# Patient Record
Sex: Male | Born: 1970
Health system: Southern US, Community
[De-identification: ages and names within clinical notes are randomized; demographics above are authoritative.]

## PROBLEM LIST (undated history)

## (undated) DIAGNOSIS — L309 Dermatitis, unspecified: Secondary | ICD-10-CM

## (undated) DIAGNOSIS — K649 Unspecified hemorrhoids: Secondary | ICD-10-CM

## (undated) DIAGNOSIS — U071 COVID-19: Secondary | ICD-10-CM

## (undated) DIAGNOSIS — R7303 Prediabetes: Secondary | ICD-10-CM

## (undated) DIAGNOSIS — K769 Liver disease, unspecified: Secondary | ICD-10-CM

## (undated) DIAGNOSIS — I2583 Coronary atherosclerosis due to lipid rich plaque: Secondary | ICD-10-CM

## (undated) DIAGNOSIS — C73 Malignant neoplasm of thyroid gland: Secondary | ICD-10-CM

## (undated) DIAGNOSIS — B1910 Unspecified viral hepatitis B without hepatic coma: Secondary | ICD-10-CM

## (undated) DIAGNOSIS — B181 Chronic viral hepatitis B without delta-agent: Secondary | ICD-10-CM

## (undated) HISTORY — DX: Dermatitis, unspecified: L30.9

## (undated) HISTORY — DX: COVID-19: U07.1

## (undated) HISTORY — DX: Chronic viral hepatitis B without delta-agent: B18.1

## (undated) HISTORY — DX: Prediabetes: R73.03

## (undated) HISTORY — PX: HEMORRHOID SURGERY: SHX153

## (undated) HISTORY — DX: Coronary atherosclerosis due to lipid rich plaque: I25.83

## (undated) HISTORY — PX: COLONOSCOPY: SHX174

## (undated) HISTORY — DX: Liver disease, unspecified: K76.9

## (undated) HISTORY — DX: Malignant neoplasm of thyroid gland: C73

## (undated) HISTORY — DX: Unspecified hemorrhoids: K64.9

## (undated) HISTORY — DX: Unspecified viral hepatitis B without hepatic coma: B19.10

## (undated) HISTORY — PX: TOTAL THYROIDECTOMY: SHX2547

---

## 2011-05-07 ENCOUNTER — Encounter: Payer: Self-pay | Admitting: Internal Medicine

## 2016-10-18 DIAGNOSIS — A048 Other specified bacterial intestinal infections: Secondary | ICD-10-CM

## 2016-10-18 HISTORY — DX: Other specified bacterial intestinal infections: A04.8

## 2017-03-29 ENCOUNTER — Ambulatory Visit (INDEPENDENT_AMBULATORY_CARE_PROVIDER_SITE_OTHER): Payer: BLUE CROSS/BLUE SHIELD

## 2017-03-29 ENCOUNTER — Encounter: Payer: Self-pay | Admitting: Family Medicine

## 2017-03-29 ENCOUNTER — Ambulatory Visit (INDEPENDENT_AMBULATORY_CARE_PROVIDER_SITE_OTHER): Payer: BLUE CROSS/BLUE SHIELD | Admitting: Family Medicine

## 2017-03-29 VITALS — BP 148/86 | HR 83 | Temp 98.3°F | Resp 16 | Ht 69.5 in | Wt 162.6 lb

## 2017-03-29 DIAGNOSIS — R059 Cough, unspecified: Secondary | ICD-10-CM

## 2017-03-29 DIAGNOSIS — F5104 Psychophysiologic insomnia: Secondary | ICD-10-CM | POA: Diagnosis not present

## 2017-03-29 DIAGNOSIS — H6692 Otitis media, unspecified, left ear: Secondary | ICD-10-CM

## 2017-03-29 DIAGNOSIS — R05 Cough: Secondary | ICD-10-CM | POA: Diagnosis not present

## 2017-03-29 DIAGNOSIS — H6121 Impacted cerumen, right ear: Secondary | ICD-10-CM | POA: Diagnosis not present

## 2017-03-29 DIAGNOSIS — F4323 Adjustment disorder with mixed anxiety and depressed mood: Secondary | ICD-10-CM | POA: Diagnosis not present

## 2017-03-29 MED ORDER — AZITHROMYCIN 250 MG PO TABS: ORAL_TABLET | ORAL | 0 refills | Status: DC

## 2017-03-29 MED ORDER — CLONAZEPAM 0.5 MG PO TABS: 0.5000 mg | ORAL_TABLET | Freq: Two times a day (BID) | ORAL | 1 refills | Status: DC | PRN

## 2017-03-29 NOTE — Progress Notes (Signed)
Subjective:  By signing my name below, I, Allen Ellis, attest that this documentation has been prepared under the direction and in the presence of Allen Agreste, MD Electronically Signed: Ladene Ellis, ED Scribe 03/29/2017 at 4:12 PM.   Patient ID: Allen Ellis, male    DOB: June 08, 1971, 46 y.o.   MRN: [id number]  Chief Complaint  Patient presents with  . Depression    PER TRIAGE  . URI    WOULD LIKE PRINTED PRESCRIPTIONS   HPI  Allen Ellis is a 46 y.o. male who presents to Primary Care at [facility] complaining of URI symptoms for the past 3-4 days. Pt reports symptoms of subjective fever, chills, right ear pressure, nasal congestion, rhinorrhea, sore throat, productive cough with yellow/green sputum, back pain. He states that his children have been ill with similar symptoms. Pt has tried Mucinex without significant relief. He last had an ear infection 5-6 months ago. H/o thyroid CA; radioactive. Not on chemotherapy now.   Depression  Pt has been struggling with depression for the past 2 months. He states that he moved to [place], [place] from [place] for a job. States that his wife and 3 children were supposed to move here with him but now his wife has expressed to him that she no longer wants to move. She now wants to move the children to [place], [place] with her parents. Pt states that he has not met with a therapist or a counselor but he has discussed depression with his sisters. Pt admits to SI approximately 1 month ago when his wife first asked for a divorce but denies SI since. He denies a h/o SI attempts or a h/o depression in the past. Pt has been taking half a tablet of 10 mg tablet nightly which he borrowed from a friend, which he states has not provided any relief. He states his mind is constantly racing and he is only able to get 2-3 hours of sleep. Pt reports occasional alcohol use but denies increasing consumption recently.   There are no active problems to display for this  patient.  No past medical history on file. Past Surgical History:  Procedure Laterality Date  . THYRIOD     Allergies not on file Prior to Admission medications   Medication Sig Start Date End Date Taking? Authorizing Provider  guaiFENesin (MUCINEX) 600 MG 12 hr tablet Take by mouth 2 (two) times daily.   Yes [provider]  levothyroxine (SYNTHROID, LEVOTHROID) 125 MCG tablet Take 125 mcg by mouth daily before breakfast.   Yes [provider]   Social History   Social History  . Marital status: Single    Spouse name: N/A  . Number of children: N/A  . Years of education: N/A   Occupational History  . Not on file.   Social History Main Topics  . Smoking status: Never Smoker  . Smokeless tobacco: Never Used  . Alcohol use Not on file  . Drug use: Unknown  . Sexual activity: Not on file   Other Topics Concern  . Not on file   Social History Narrative  . No narrative on file   Review of Systems  Constitutional: Positive for chills and fever (subjective).  HENT: Positive for congestion, rhinorrhea and sore throat.   Respiratory: Positive for cough.   Musculoskeletal: Positive for back pain.  Psychiatric/Behavioral: Positive for dysphoric mood and sleep disturbance.      Objective:   Physical Exam  Constitutional: He is oriented to person,  place, and time. He appears well-developed and well-nourished.  HENT:  Head: Normocephalic and atraumatic.  Right Ear: Tympanic membrane, external ear and ear canal normal.  Left Ear: Tympanic membrane, external ear and ear canal normal.  Nose: No rhinorrhea. Right sinus exhibits maxillary sinus tenderness (slight). Right sinus exhibits no frontal sinus tenderness. Left sinus exhibits no maxillary sinus tenderness and no frontal sinus tenderness.  Mouth/Throat: Oropharynx is clear and moist and mucous membranes are normal. No oropharyngeal exudate or posterior oropharyngeal erythema.  L TM: pearly grey. R TM:  obstructed by cerumen. Small amt of dark brown cerumen removed but still unable to visualize TM. After cerumen leverage, pt does have some erythema with slight injection. Possible bulla versus bulging.  Eyes: Conjunctivae are normal. Pupils are equal, round, and reactive to light.  Neck: Neck supple.  Cardiovascular: Normal rate, regular rhythm, normal heart sounds and intact distal pulses.   No murmur heard. Pulmonary/Chest: Effort normal and breath sounds normal. He has no wheezes. He has no rhonchi. He has no rales.  Few coarse breath sounds, R upper, L middle  Abdominal: Soft. There is no tenderness.  Lymphadenopathy:    He has no cervical adenopathy.  Neurological: He is alert and oriented to person, place, and time.  Skin: Skin is warm and dry. No rash noted.  Psychiatric: He has a normal mood and affect. His behavior is normal.  Vitals reviewed.  Vitals:   03/29/17 1522  BP: (!) 148/86  Pulse: 83  Resp: 16  Temp: 98.3 F (36.8 C)  TempSrc: Oral  SpO2: 98%  Weight: 162 lb 9.6 oz (73.8 kg)  Height: 5' 9.5" (1.765 m)   Dg Chest 2 View  Result Date: 03/29/2017 CLINICAL DATA:  Fever and cough EXAM: CHEST  2 VIEW COMPARISON:  None. FINDINGS: Lungs are clear. Heart size and pulmonary vascularity are normal. No adenopathy. No evident bone lesions. IMPRESSION: No edema or consolidation. Electronically Signed   By: Lowella Grip III M.D.   On: 03/29/2017 17:04      Assessment & Plan:    Allen Ellis is a 46 y.o. male Cough - Plan: DG Chest 2 View, azithromycin (ZITHROMAX) 250 MG tablet Chest x-ray clear, afebrile. Possible viral illness but will also be on azithromycin to cover for early ear infection. Symptomatic care with Mucinex, RTC precautions discussed.  Impacted cerumen of right ear - Plan: Ear wax removal  -Lavage performed in office with resolution of impacted cerumen, ability to visualize TM  Adjustment disorder with mixed anxiety and depressed mood - Plan:  clonazePAM (KLONOPIN) 0.5 MG tablet Psychophysiological insomnia  -Suspected adjustment disorder with recent news of his wife wanting to divorce as well as children living in [place] and him living here. He did endorse prior symptoms of suicidal ideation, but denies those recently, no prior intent or plan.  -Handout given on adjustment disorder, phone numbers provided for counseling and asked him to call to schedule appointment within the next 1 week. Additionally provided number for behavioral health or advised emergency room evaluation if any acute worsening of the symptoms, including any return of suicidal ideation. Understanding expressed.  -Klonopin provided temporarily, potential side effects/risks discussed.  -Recheck in 1 week, consider antidepressant if persistent depression symptoms  Right  otitis media, unspecified otitis media type - Plan: azithromycin (ZITHROMAX) 250 MG tablet  -Early right otitis media, possible early bullous myringitis. Reports recurrent ear infections in the past treated by ENT. Start with azithromycin, RTC precautions and recheck next  week as planned -  sooner if worse  Meds ordered this encounter  Medications  . levothyroxine (SYNTHROID, LEVOTHROID) 125 MCG tablet    Sig: Take 125 mcg by mouth daily before breakfast.  . guaiFENesin (MUCINEX) 600 MG 12 hr tablet    Sig: Take by mouth 2 (two) times daily.  . clonazePAM (KLONOPIN) 0.5 MG tablet    Sig: Take 1 tablet (0.5 mg total) by mouth 2 (two) times daily as needed for anxiety.    Dispense:  20 tablet    Refill:  1  . azithromycin (ZITHROMAX) 250 MG tablet    Sig: Take 2 pills by mouth on day 1, then 1 pill by mouth per day on days 2 through 5.    Dispense:  6 tablet    Refill:  0   Patient Instructions   I would like you to meet with a counselor within next 1 week. I wrote for clonazepam to be taken up to twice per day. That medicine may help you sleep, but can take 1/2-1 pill during the day if  needed for anxiety. If any suicidal thoughts, call 911 or go to the emergency room.   Counseling: Vivia Budge: [phone] [place]: [facility]  Phone: [phone]   You may have the start of an early ear infection, but chest x-ray was normal, did not see pneumonia. Start azithromycin, antibiotic to cover both ear infections and may also help with cough. Continue Mucinex over-the-counter for cough. Recheck in the next 2-3 days if your pain is not improving, sooner if worse. Tylenol or Motrin over-the-counter as needed.   Otitis Media, Adult Otitis media occurs when there is inflammation and fluid in the middle ear. Your middle ear is a part of the ear that contains bones for hearing as well as air that helps send sounds to your brain. What are the causes? This condition is caused by a blockage in the eustachian tube. This tube drains fluid from the ear to the back of the nose (nasopharynx). A blockage in this tube can be caused by an object or by swelling (edema) in the tube. Problems that can cause a blockage include:  A cold or other upper respiratory infection.  Allergies.  An irritant, such as tobacco smoke.  Enlarged adenoids. The adenoids are areas of soft tissue located high in the back of the throat, behind the nose and the roof of the mouth.  A mass in the nasopharynx.  Damage to the ear caused by pressure changes (barotrauma).  What are the signs or symptoms? Symptoms of this condition include:  Ear pain.  A fever.  Decreased hearing.  A headache.  Tiredness (lethargy).  Fluid leaking from the ear.  Ringing in the ear.  How is this diagnosed? This condition is diagnosed with a physical exam. During the exam your health care provider will use an instrument called an otoscope to look into your ear and check for redness, swelling, and fluid. He or she will also ask about your symptoms. Your health care provider may  also order tests, such as:  A test to check the movement of the eardrum (pneumatic otoscopy). This test is done by squeezing a small amount of air into the ear.  A test that changes air pressure in the middle ear to check how well the eardrum moves and whether the eustachian tube is working (tympanogram).  How is this treated? This condition usually goes away on its own  within 3-5 days. But if the condition is caused by a bacteria infection and does not go away own its own, or keeps coming back, your health care provider may:  Prescribe antibiotic medicines to treat the infection.  Prescribe or recommend medicines to control pain.  Follow these instructions at home:  Take over-the-counter and prescription medicines only as told by your health care provider.  If you were prescribed an antibiotic medicine, take it as told by your health care provider. Do not stop taking the antibiotic even if you start to feel better.  Keep all follow-up visits as told by your health care provider. This is important. Contact a health care provider if:  You have bleeding from your nose.  There is a lump on your neck.  You are not getting better in 5 days.  You feel worse instead of better. Get help right away if:  You have severe pain that is not controlled with medicine.  You have swelling, redness, or pain around your ear.  You have stiffness in your neck.  A part of your face is paralyzed.  The bone behind your ear (mastoid) is tender when you touch it.  You develop a severe headache. Summary  Otitis media is redness, soreness, and swelling of the middle ear.  This condition usually goes away on its own within 3-5 days.  If the problem does not go away in 3-5 days, your health care provider may prescribe or recommend medicines to treat your symptoms.  If you were prescribed an antibiotic medicine, take it as told by your health care provider. This information is not intended to  replace advice given to you by your health care provider. Make sure you discuss any questions you have with your health care provider. Document Released: 07/09/2004 Document Revised: 09/24/2016 Document Reviewed: 09/24/2016 Elsevier Interactive Patient Education  2017 [employer] Inc[employer]    Adjustment Disorder, Adult Adjustment disorder is a group of symptoms that can develop after a stressful life event, such as the loss of a job or serious physical illness. The symptoms can affect how you feel, think, and act. They may interfere with your relationships. Adjustment disorder increases your risk of suicide and substance abuse. If this disorder is not managed early, it can develop into a more serious condition, such as major depressive disorder or post-traumatic stress disorder. What are the causes? This condition happens when you have trouble recovering from or coping with a stressful life event. What increases the risk? You are more likely to develop this condition if:  You have had depression or anxiety.  You are being treated for a long-term (chronic) illness.  You are being treated for an illness that cannot be cured (terminal illness).  You have a family history of mental illness.  What are the signs or symptoms? Symptoms of this condition include:  Extreme trouble doing daily tasks, such as going to work.  Sadness, depression, or crying spells.  Worrying a lot.  Loss of enjoyment.  Change in appetite or weight.  Feelings of loss or hopelessness.  Thoughts of suicide.  Anxiety, worry, or nervousness.  Trouble sleeping.  Avoiding family and friends.  Fighting or vandalism.  Complaining of feeling sick without being ill.  Feeling dazed or disconnected.  Nightmares.  Trouble sleeping.  Irritability.  Reckless driving.  Poor work Systems analyst.  Ignoring bills.  Symptoms of this condition start within three months of the stressful event. They do not last more  than six months, unless the  stressful circumstances last longer. Normal grieving after the death of a loved one is not a symptom of this condition. How is this diagnosed? To diagnose this condition, your health care provider will ask about what has happened in your life and how it has affected you. He or she may also ask about your medical history and your use of medicines, alcohol, and other substances. Your health care provider may do a physical exam and order lab tests or other studies. You may be referred to a mental health specialist. How is this treated? Treatment options for this condition include:  Counseling or talk therapy. Talk therapy is usually provided by mental health specialists.  Medicines. Certain medicines may help with depression, anxiety, and sleep.  Support groups. These offer emotional support, advice, and guidance. They are made up of people who have had similar experiences.  Observation and time. This is sometimes called "watchful waiting." In this treatment, health care providers monitor your health and behavior without other treatment. Adjustment disorder sometimes gets better on its own with time.  Follow these instructions at home:  Take over-the-counter and prescription medicines only as told by your health care provider.  Keep all follow-up visits as told by your health care provider. This is important. Contact a health care provider if:  Your symptoms do not improve in six months.  Your symptoms get worse. Get help right away if:  You have serious thoughts about hurting yourself or someone else. If you ever feel like you may hurt yourself or others, or have thoughts about taking your own life, get help right away. You can go to your nearest emergency department or call:  Your local emergency services (911 in the [place]  A suicide crisis helpline, such as the [facility] at 605-648-7936. This is open 24 hours a  day.  Summary  Adjustment disorder is a group of symptoms that can develop after a stressful life event, such as the loss of a job or serious physical illness. The symptoms can affect how you feel, think, and act. They may interfere with your relationships.  Symptoms of this condition start within three months of the stressful event. They do not last more than six months, unless the stressful circumstances last longer.  Treatment may include talk therapy, medicines, participation in a support group, or observation to see if symptoms improve.  Contact your health care provider if your symptoms get worse or do not improve in six months.  If you ever feel like you may hurt yourself or others, or have thoughts about taking your own life, get help right away. This information is not intended to replace advice given to you by your health care provider. Make sure you discuss any questions you have with your health care provider. Document Released: 06/08/2006 Document Revised: 12/03/2016 Document Reviewed: 12/03/2016 Elsevier Interactive Patient Education  2018 [place], Adult The ears produce a substance called earwax that helps keep bacteria out of the ear and protects the skin in the ear canal. Occasionally, earwax can build up in the ear and cause discomfort or hearing loss. What increases the risk? This condition is more likely to develop in people who:  Are male.  Are elderly.  Naturally produce more earwax.  Clean their ears often with cotton swabs.  Use earplugs often.  Use in-ear headphones often.  Wear hearing aids.  Have narrow ear canals.  Have earwax that is overly thick or sticky.  Have  eczema.  Are dehydrated.  Have excess hair in the ear canal.  What are the signs or symptoms? Symptoms of this condition include:  Reduced or muffled hearing.  A feeling of fullness in the ear or feeling that the ear is plugged.  Fluid coming from  the ear.  Ear pain.  Ear itch.  Ringing in the ear.  Coughing.  An obvious piece of earwax that can be seen inside the ear canal.  How is this diagnosed? This condition may be diagnosed based on:  Your symptoms.  Your medical history.  An ear exam. During the exam, your health care provider will look into your ear with an instrument called an otoscope.  You may have tests, including a hearing test. How is this treated? This condition may be treated by:  Using ear drops to soften the earwax.  Having the earwax removed by a health care provider. The health care provider may: ? Flush the ear with water. ? Use an instrument that has a loop on the end (curette). ? Use a suction device.  Surgery to remove the wax buildup. This may be done in severe cases.  Follow these instructions at home:  Take over-the-counter and prescription medicines only as told by your health care provider.  Do not put any objects, including cotton swabs, into your ear. You can clean the opening of your ear canal with a washcloth or facial tissue.  Follow instructions from your health care provider about cleaning your ears. Do not over-clean your ears.  Drink enough fluid to keep your urine clear or pale yellow. This will help to thin the earwax.  Keep all follow-up visits as told by your health care provider. If earwax builds up in your ears often or if you use hearing aids, consider seeing your health care provider for routine, preventive ear cleanings. Ask your health care provider how often you should schedule your cleanings.  If you have hearing aids, clean them according to instructions from the manufacturer and your health care provider. Contact a health care provider if:  You have ear pain.  You develop a fever.  You have blood, pus, or other fluid coming from your ear.  You have hearing loss.  You have ringing in your ears that does not go away.  Your symptoms do not improve with  treatment.  You feel like the room is spinning (vertigo). Summary  Earwax can build up in the ear and cause discomfort or hearing loss.  The most common symptoms of this condition include reduced or muffled hearing and a feeling of fullness in the ear or feeling that the ear is plugged.  This condition may be diagnosed based on your symptoms, your medical history, and an ear exam.  This condition may be treated by using ear drops to soften the earwax or by having the earwax removed by a health care provider.  Do not put any objects, including cotton swabs, into your ear. You can clean the opening of your ear canal with a washcloth or facial tissue. This information is not intended to replace advice given to you by your health care provider. Make sure you discuss any questions you have with your health care provider. Document Released: 11/11/2004 Document Revised: 12/15/2016 Document Reviewed: 12/15/2016 Elsevier Interactive Patient Education  2018 [employer].    IF you received an x-ray today, you will receive an invoice from [facility]. Please contact [facility] at [phone] with questions or concerns regarding your invoice.  IF you received labwork today, you will receive an invoice from [facility]. Please contact [facility] at [phone] with questions or concerns regarding your invoice.   Our billing staff will not be able to assist you with questions regarding bills from these companies.  You will be contacted with the lab results as soon as they are available. The fastest way to get your results is to activate your My Chart account. Instructions are located on the last page of this paperwork. If you have not heard from [place] regarding the results in 2 weeks, please contact this office.      I personally performed the services described in this documentation, which was scribed in my presence. The recorded information has been reviewed and considered for  accuracy and completeness, addended by me as needed, and agree with information above.  Signed,   Merri Ray, MD [facility].  03/30/17 3:08 PM

## 2017-03-29 NOTE — Patient Instructions (Addendum)
I would like you to meet with a counselor within next 1 week. I wrote for clonazepam to be taken up to twice per day. That medicine may help you sleep, but can take 1/2-1 pill during the day if needed for anxiety. If any suicidal thoughts, call 911 or go to the emergency room.   Counseling: Vivia Budge: [phone] [place]: [facility]  Phone: [phone]   You may have the start of an early ear infection, but chest x-ray was normal, did not see pneumonia. Start azithromycin, antibiotic to cover both ear infections and may also help with cough. Continue Mucinex over-the-counter for cough. Recheck in the next 2-3 days if your pain is not improving, sooner if worse. Tylenol or Motrin over-the-counter as needed.   Otitis Media, Adult Otitis media occurs when there is inflammation and fluid in the middle ear. Your middle ear is a part of the ear that contains bones for hearing as well as air that helps send sounds to your brain. What are the causes? This condition is caused by a blockage in the eustachian tube. This tube drains fluid from the ear to the back of the nose (nasopharynx). A blockage in this tube can be caused by an object or by swelling (edema) in the tube. Problems that can cause a blockage include:  A cold or other upper respiratory infection.  Allergies.  An irritant, such as tobacco smoke.  Enlarged adenoids. The adenoids are areas of soft tissue located high in the back of the throat, behind the nose and the roof of the mouth.  A mass in the nasopharynx.  Damage to the ear caused by pressure changes (barotrauma).  What are the signs or symptoms? Symptoms of this condition include:  Ear pain.  A fever.  Decreased hearing.  A headache.  Tiredness (lethargy).  Fluid leaking from the ear.  Ringing in the ear.  How is this diagnosed? This condition is diagnosed with a physical exam. During the exam your health  care provider will use an instrument called an otoscope to look into your ear and check for redness, swelling, and fluid. He or she will also ask about your symptoms. Your health care provider may also order tests, such as:  A test to check the movement of the eardrum (pneumatic otoscopy). This test is done by squeezing a small amount of air into the ear.  A test that changes air pressure in the middle ear to check how well the eardrum moves and whether the eustachian tube is working (tympanogram).  How is this treated? This condition usually goes away on its own within 3-5 days. But if the condition is caused by a bacteria infection and does not go away own its own, or keeps coming back, your health care provider may:  Prescribe antibiotic medicines to treat the infection.  Prescribe or recommend medicines to control pain.  Follow these instructions at home:  Take over-the-counter and prescription medicines only as told by your health care provider.  If you were prescribed an antibiotic medicine, take it as told by your health care provider. Do not stop taking the antibiotic even if you start to feel better.  Keep all follow-up visits as told by your health care provider. This is important. Contact a health care provider if:  You have bleeding from your nose.  There is a lump on your neck.  You are not getting better in 5 days.  You feel worse instead  of better. Get help right away if:  You have severe pain that is not controlled with medicine.  You have swelling, redness, or pain around your ear.  You have stiffness in your neck.  A part of your face is paralyzed.  The bone behind your ear (mastoid) is tender when you touch it.  You develop a severe headache. Summary  Otitis media is redness, soreness, and swelling of the middle ear.  This condition usually goes away on its own within 3-5 days.  If the problem does not go away in 3-5 days, your health care provider  may prescribe or recommend medicines to treat your symptoms.  If you were prescribed an antibiotic medicine, take it as told by your health care provider. This information is not intended to replace advice given to you by your health care provider. Make sure you discuss any questions you have with your health care provider. Document Released: 07/09/2004 Document Revised: 09/24/2016 Document Reviewed: 09/24/2016 Elsevier Interactive Patient Education  2017 Elsevier Inc[employer]    Adjustment Disorder, Adult Adjustment disorder is a group of symptoms that can develop after a stressful life event, such as the loss of a job or serious physical illness. The symptoms can affect how you feel, think, and act. They may interfere with your relationships. Adjustment disorder increases your risk of suicide and substance abuse. If this disorder is not managed early, it can develop into a more serious condition, such as major depressive disorder or post-traumatic stress disorder. What are the causes? This condition happens when you have trouble recovering from or coping with a stressful life event. What increases the risk? You are more likely to develop this condition if:  You have had depression or anxiety.  You are being treated for a long-term (chronic) illness.  You are being treated for an illness that cannot be cured (terminal illness).  You have a family history of mental illness.  What are the signs or symptoms? Symptoms of this condition include:  Extreme trouble doing daily tasks, such as going to work.  Sadness, depression, or crying spells.  Worrying a lot.  Loss of enjoyment.  Change in appetite or weight.  Feelings of loss or hopelessness.  Thoughts of suicide.  Anxiety, worry, or nervousness.  Trouble sleeping.  Avoiding family and friends.  Fighting or vandalism.  Complaining of feeling sick without being ill.  Feeling dazed or disconnected.  Nightmares.  Trouble  sleeping.  Irritability.  Reckless driving.  Poor work Systems analyst.  Ignoring bills.  Symptoms of this condition start within three months of the stressful event. They do not last more than six months, unless the stressful circumstances last longer. Normal grieving after the death of a loved one is not a symptom of this condition. How is this diagnosed? To diagnose this condition, your health care provider will ask about what has happened in your life and how it has affected you. He or she may also ask about your medical history and your use of medicines, alcohol, and other substances. Your health care provider may do a physical exam and order lab tests or other studies. You may be referred to a mental health specialist. How is this treated? Treatment options for this condition include:  Counseling or talk therapy. Talk therapy is usually provided by mental health specialists.  Medicines. Certain medicines may help with depression, anxiety, and sleep.  Support groups. These offer emotional support, advice, and guidance. They are made up of people who have had similar  experiences.  Observation and time. This is sometimes called "watchful waiting." In this treatment, health care providers monitor your health and behavior without other treatment. Adjustment disorder sometimes gets better on its own with time.  Follow these instructions at home:  Take over-the-counter and prescription medicines only as told by your health care provider.  Keep all follow-up visits as told by your health care provider. This is important. Contact a health care provider if:  Your symptoms do not improve in six months.  Your symptoms get worse. Get help right away if:  You have serious thoughts about hurting yourself or someone else. If you ever feel like you may hurt yourself or others, or have thoughts about taking your own life, get help right away. You can go to your nearest emergency department or  call:  Your local emergency services (911 in the [place]  A suicide crisis helpline, such as the [facility] at 781-752-4697. This is open 24 hours a day.  Summary  Adjustment disorder is a group of symptoms that can develop after a stressful life event, such as the loss of a job or serious physical illness. The symptoms can affect how you feel, think, and act. They may interfere with your relationships.  Symptoms of this condition start within three months of the stressful event. They do not last more than six months, unless the stressful circumstances last longer.  Treatment may include talk therapy, medicines, participation in a support group, or observation to see if symptoms improve.  Contact your health care provider if your symptoms get worse or do not improve in six months.  If you ever feel like you may hurt yourself or others, or have thoughts about taking your own life, get help right away. This information is not intended to replace advice given to you by your health care provider. Make sure you discuss any questions you have with your health care provider. Document Released: 06/08/2006 Document Revised: 12/03/2016 Document Reviewed: 12/03/2016 Elsevier Interactive Patient Education  2018 [place], Adult The ears produce a substance called earwax that helps keep bacteria out of the ear and protects the skin in the ear canal. Occasionally, earwax can build up in the ear and cause discomfort or hearing loss. What increases the risk? This condition is more likely to develop in people who:  Are male.  Are elderly.  Naturally produce more earwax.  Clean their ears often with cotton swabs.  Use earplugs often.  Use in-ear headphones often.  Wear hearing aids.  Have narrow ear canals.  Have earwax that is overly thick or sticky.  Have eczema.  Are dehydrated.  Have excess hair in the ear canal.  What are the  signs or symptoms? Symptoms of this condition include:  Reduced or muffled hearing.  A feeling of fullness in the ear or feeling that the ear is plugged.  Fluid coming from the ear.  Ear pain.  Ear itch.  Ringing in the ear.  Coughing.  An obvious piece of earwax that can be seen inside the ear canal.  How is this diagnosed? This condition may be diagnosed based on:  Your symptoms.  Your medical history.  An ear exam. During the exam, your health care provider will look into your ear with an instrument called an otoscope.  You may have tests, including a hearing test. How is this treated? This condition may be treated by:  Using ear drops to soften the earwax.  Having the earwax removed by a health care provider. The health care provider may: ? Flush the ear with water. ? Use an instrument that has a loop on the end (curette). ? Use a suction device.  Surgery to remove the wax buildup. This may be done in severe cases.  Follow these instructions at home:  Take over-the-counter and prescription medicines only as told by your health care provider.  Do not put any objects, including cotton swabs, into your ear. You can clean the opening of your ear canal with a washcloth or facial tissue.  Follow instructions from your health care provider about cleaning your ears. Do not over-clean your ears.  Drink enough fluid to keep your urine clear or pale yellow. This will help to thin the earwax.  Keep all follow-up visits as told by your health care provider. If earwax builds up in your ears often or if you use hearing aids, consider seeing your health care provider for routine, preventive ear cleanings. Ask your health care provider how often you should schedule your cleanings.  If you have hearing aids, clean them according to instructions from the manufacturer and your health care provider. Contact a health care provider if:  You have ear pain.  You develop a  fever.  You have blood, pus, or other fluid coming from your ear.  You have hearing loss.  You have ringing in your ears that does not go away.  Your symptoms do not improve with treatment.  You feel like the room is spinning (vertigo). Summary  Earwax can build up in the ear and cause discomfort or hearing loss.  The most common symptoms of this condition include reduced or muffled hearing and a feeling of fullness in the ear or feeling that the ear is plugged.  This condition may be diagnosed based on your symptoms, your medical history, and an ear exam.  This condition may be treated by using ear drops to soften the earwax or by having the earwax removed by a health care provider.  Do not put any objects, including cotton swabs, into your ear. You can clean the opening of your ear canal with a washcloth or facial tissue. This information is not intended to replace advice given to you by your health care provider. Make sure you discuss any questions you have with your health care provider. Document Released: 11/11/2004 Document Revised: 12/15/2016 Document Reviewed: 12/15/2016 Elsevier Interactive Patient Education  2018 [employer].    IF you received an x-ray today, you will receive an invoice from [facility]. Please contact [facility] at [phone] with questions or concerns regarding your invoice.   IF you received labwork today, you will receive an invoice from [facility]. Please contact [facility] at [phone] with questions or concerns regarding your invoice.   Our billing staff will not be able to assist you with questions regarding bills from these companies.  You will be contacted with the lab results as soon as they are available. The fastest way to get your results is to activate your My Chart account. Instructions are located on the last page of this paperwork. If you have not heard from [place] regarding the results in 2 weeks, please contact this  office.

## 2017-04-07 ENCOUNTER — Ambulatory Visit: Payer: BLUE CROSS/BLUE SHIELD | Admitting: Family Medicine

## 2017-07-14 ENCOUNTER — Ambulatory Visit (INDEPENDENT_AMBULATORY_CARE_PROVIDER_SITE_OTHER): Payer: BLUE CROSS/BLUE SHIELD | Admitting: Family Medicine

## 2017-07-14 ENCOUNTER — Encounter: Payer: Self-pay | Admitting: Family Medicine

## 2017-07-14 VITALS — BP 118/86 | HR 68 | Temp 97.8°F | Resp 16 | Ht 66.5 in | Wt 169.0 lb

## 2017-07-14 DIAGNOSIS — Z8249 Family history of ischemic heart disease and other diseases of the circulatory system: Secondary | ICD-10-CM

## 2017-07-14 DIAGNOSIS — H60392 Other infective otitis externa, left ear: Secondary | ICD-10-CM | POA: Diagnosis not present

## 2017-07-14 DIAGNOSIS — I208 Other forms of angina pectoris: Secondary | ICD-10-CM

## 2017-07-14 DIAGNOSIS — L309 Dermatitis, unspecified: Secondary | ICD-10-CM

## 2017-07-14 MED ORDER — TRIAMCINOLONE ACETONIDE 0.1 % EX CREA: 1.0000 "application " | TOPICAL_CREAM | Freq: Two times a day (BID) | CUTANEOUS | 0 refills | Status: DC

## 2017-07-14 MED ORDER — CIPROFLOXACIN-HYDROCORTISONE 0.2-1 % OT SUSP: 3.0000 [drp] | Freq: Two times a day (BID) | OTIC | 0 refills | Status: AC

## 2017-07-14 MED ORDER — CIPROFLOXACIN-HYDROCORTISONE 0.2-1 % OT SUSP: 3.0000 [drp] | Freq: Two times a day (BID) | OTIC | 0 refills | Status: DC

## 2017-07-14 NOTE — Patient Instructions (Addendum)
IF you received an x-ray today, you will receive an invoice from [facility]. Please contact [facility] at [phone] with questions or concerns regarding your invoice.   IF you received labwork today, you will receive an invoice from [facility] [place]. Please contact [facility] at [phone] with questions or concerns regarding your invoice.   Our billing staff will not be able to assist you with questions regarding bills from these companies.  You will be contacted with the lab results as soon as they are available. The fastest way to get your results is to activate your My Chart account. Instructions are located on the last page of this paperwork. If you have not heard from [place] regarding the results in 2 weeks, please contact this office.    '  Otitis Externa Otitis externa is an infection of the outer ear canal. The outer ear canal is the area between the outside of the ear and the eardrum. Otitis externa is sometimes called "swimmer's ear." What are the causes? This condition may be caused by:  Swimming in dirty water.  Moisture in the ear.  An injury to the inside of the ear.  An object stuck in the ear.  A cut or scrape on the outside of the ear.  What increases the risk? This condition is more likely to develop in swimmers. What are the signs or symptoms? The first symptom of this condition is often itching in the ear. Later signs and symptoms include:  Swelling of the ear.  Redness in the ear.  Ear pain. The pain may get worse when you pull on your ear.  Pus coming from the ear.  How is this diagnosed? This condition may be diagnosed by examining the ear and testing fluid from the ear for bacteria and funguses. How is this treated? This condition may be treated with:  Antibiotic ear drops. These are often given for 10-14 days.  Medicine to reduce itching and swelling.  Follow these instructions at home:  If you were prescribed antibiotic  ear drops, apply them as told by your health care provider. Do not stop using the antibiotic even if your condition improves.  Take over-the-counter and prescription medicines only as told by your health care provider.  Keep all follow-up visits as told by your health care provider. This is important. How is this prevented?  Keep your ear dry. Use the corner of a towel to dry your ear after you swim or bathe.  Avoid scratching or putting things in your ear. Doing these things can damage the ear canal or remove the protective wax that lines it, which makes it easier for bacteria and funguses to grow.  Avoid swimming in lakes, polluted water, or pools that may not have the right amount of chlorine.  Consider making ear drops and putting 3 or 4 drops in each ear after you swim. Ask your health care provider about how you can make ear drops. Contact a health care provider if:  You have a fever.  After 3 days your ear is still red, swollen, painful, or draining pus.  Your redness, swelling, or pain gets worse.  You have a severe headache.  You have redness, swelling, pain, or tenderness in the area behind your ear. This information is not intended to replace advice given to you by your health care provider. Make sure you discuss any questions you have with your health care provider. Document Released: 10/04/2005 Document Revised: 11/11/2015 Document Reviewed: 07/14/2015 Elsevier Interactive  Patient Education  2018 [place] Dermatitis Hand dermatitis is a skin condition that causes small, itchy, raised dots or fluid-filled blisters to form over the palms of the hands. This condition may also be called hand eczema. What are the causes? The cause of this condition is not known. What increases the risk? This condition is more likely to develop in people who have a history of allergies, such as:  Hay fever.  Allergic asthma.  An allergy to latex.  Chemical exposure,  injuries, and environmental irritants can make hand dermatitis worse. Washing your hands too often can remove natural oils, which can dry out the skin and contribute to outbreaks of this condition. What are the signs or symptoms? The most common symptom of this condition is intense itchiness. Cracks or grooves (fissures) on the fingers can also develop. Affected areas can be painful, especially areas where large blisters have formed. How is this diagnosed? This condition is diagnosed with a medical history and physical exam. How is this treated? This condition is treated with medicines, including:  Steroid creams and ointments.  Oral steroid medicines.  Antibiotic medicines. These are prescribed if you have an infection.  Antihistamine medicines. These help to reduce itchiness.  Follow these instructions at home:  Take or apply over-the-counter and prescription medicines only as told by your health care provider.  If you were prescribed an antibiotic medicine, use it as told by your health care provider. Do not stop using the antibiotic even if you start to feel better.  Avoid washing your hands more often than necessary.  Avoid using harsh chemicals on your hands.  Wear protective gloves when you handle products that can irritate your skin.  Keep all follow-up visits as told by your health care provider. This is important. Contact a health care provider if:  Your rash does not improve during the first week of treatment.  Your rash is red or tender.  Your rash has pus coming from it.  Your rash spreads. This information is not intended to replace advice given to you by your health care provider. Make sure you discuss any questions you have with your health care provider. Document Released: 10/04/2005 Document Revised: 03/11/2016 Document Reviewed: 04/18/2015 Elsevier Interactive Patient Education  [employer].

## 2017-07-14 NOTE — Progress Notes (Signed)
9/27/20185:26 PM  Allen Ellis [birth year], 46 y.o. male [id number]  Chief Complaint  Patient presents with  . Ear Pain    left/ x 2 days    HPI:   Patient is a 46 y.o. male with no significant past medical history who presents today for 3 days of left ear pain and drainage. He has recently started swimming as he is trying to become more active. He denies any fever or chills. He denies any nasal congestion or sore throat. He denies any cough or SOB.   He also is requesting medication for his eczema, long standing, both hands, has used clobetasol in the past.  He also mentions that several days ago he was working at home, deep cleaning and organizing, he bent over to pick up something when suddenly he got a gripping midsternal chest pain, SOB, radiated to his back. He stood up slowly, had some water, and pain finally resolved after about 10 minutes. This has never happened before. Both his parents had MI in early 50s, nonsmokers.     Not on File  Current Outpatient Prescriptions on File Prior to Visit  Medication Sig Dispense Refill  . levothyroxine (SYNTHROID, LEVOTHROID) 125 MCG tablet Take 125 mcg by mouth daily before breakfast.     No current facility-administered medications on file prior to visit.     History reviewed. No pertinent past medical history.  Past Surgical History:  Procedure Laterality Date  . THYRIOD      Social History  Substance Use Topics  . Smoking status: Never Smoker  . Smokeless tobacco: Never Used  . Alcohol use Not on file    History reviewed. No pertinent family history.  Review of Systems  Constitutional: Negative for chills and fever.  HENT: Positive for ear discharge and ear pain. Negative for congestion, hearing loss, sore throat and tinnitus.   Respiratory: Negative for cough and shortness of breath.   Cardiovascular: Positive for chest pain. Negative for palpitations and leg swelling.  Gastrointestinal: Negative for abdominal  pain, nausea and vomiting.     OBJECTIVE:  Blood pressure 118/86, pulse 68, temperature 97.8 F (36.6 C), temperature source Oral, resp. rate 16, height 5' 6.5" (1.689 m), weight 169 lb (76.7 kg), SpO2 97 %.  Physical Exam  Constitutional: He is oriented to person, place, and time and well-developed, well-nourished, and in no distress.  HENT:  Head: Normocephalic and atraumatic.  Right Ear: Hearing, tympanic membrane, external ear and ear canal normal.  Left Ear: Hearing and tympanic membrane normal. There is swelling and tenderness. No mastoid tenderness.  Mouth/Throat: Oropharynx is clear and moist.  Eyes: Pupils are equal, round, and reactive to light. EOM are normal.  Neck: Neck supple. Carotid bruit is not present.  Cardiovascular: Normal rate, regular rhythm, normal heart sounds and intact distal pulses.  Exam reveals no gallop and no friction rub.   No murmur heard. Pulmonary/Chest: Effort normal and breath sounds normal. He has no wheezes. He has no rales.  Musculoskeletal: He exhibits no edema.  Lymphadenopathy:    He has no cervical adenopathy.  Neurological: He is alert and oriented to person, place, and time. Gait normal.  Skin: Skin is warm and dry.   External ear canal gently cleansed with currette.   ASSESSMENT and PLAN  1. Infective otitis externa of left ear Discussed importance of keeping ear dried. Discussed several methods to ensure dryness. Discussed RTC precautions.  - ciprofloxacin-hydrocortisone (CIPRO HC) OTIC suspension; Place 3 drops into the left  ear 2 (two) times daily.  2. Eczema of both hands Discussed supportive measures. - triamcinolone cream (KENALOG) 0.1 %; Apply 1 application topically 2 (two) times daily.  3. Atypical angina ([place]) Further workup given strong fhx of CAD.  ER precautions given.  - EKG 12-Lead - NSR, HR 63, no q waves, no ST changes - ECHOCARDIOGRAM STRESS TEST; Future  4. FHx: early coronary artery disease - EKG  12-Lead - ECHOCARDIOGRAM STRESS TEST; Future   Return in about 1 week (around 07/21/2017).    Rutherford Guys, MD [facility] [place], [place] [address] Ph.  [phone] Fax [phone]

## 2017-07-15 DIAGNOSIS — H60332 Swimmer's ear, left ear: Secondary | ICD-10-CM | POA: Insufficient documentation

## 2017-07-21 ENCOUNTER — Telehealth: Payer: Self-pay | Admitting: Family Medicine

## 2017-07-21 ENCOUNTER — Ambulatory Visit: Payer: BLUE CROSS/BLUE SHIELD | Admitting: Family Medicine

## 2017-07-26 ENCOUNTER — Telehealth: Payer: Self-pay | Admitting: Family Medicine

## 2017-07-26 NOTE — Telephone Encounter (Signed)
Called [facility]'s Echo department to get pt scheduled but they said they are not able to schedule pt without pt first seeing cardiologist to make sure they are able to do the echo. They said I could contact [facility] to get this scheduled as they are able to do Echos from primary care since they have doctors on site. The first available [facility] had was 08/17/17 at 7:30 am. I went ahead and scheduled this appt but also called Dr. Darrin Nipper Tilley's office to see if they had anything sooner. They had an appt available for this Friday 07/29/17 so I took that and they asked me to fax the orders and prior auth to them. I called the pt to make sure this worked before obtaining prior [place] and he said he was going to be out of town for at least a week. I asked the pt when he would be back so I could go ahead and schedule him but he said he would call me back and asked me to cancel the appt with Dr. Wynonia Lawman due to their 200 dollar cancellation fee if not 24 hrs in advance. Dr. Irven Shelling office is also scheduling until mid November. I forgot to mention the 08/17/17 appt that was scheduled before talking to Dr. Thurman Coyer office so I have kept this for now and called the pt to see if he could do this when he gets back from out of town. I had to leave a message but asked that he calls me back so I know if we should keep this or not. [facility] only schedules at two different times a day twice a week so I did not want to cancel this if the pt may be able to make it. The pt can be contacted at [phone].

## 2017-07-28 NOTE — Telephone Encounter (Signed)
Noted. Thanks for working on this.

## 2017-08-09 NOTE — Telephone Encounter (Signed)
noted 

## 2017-08-09 NOTE — Telephone Encounter (Signed)
Spoke with pt to see if he could do Echo at [facility] on 10/31 and he said he is going to follow up and do Echo with doctor in [place]. Thanks! I will cancel [facility] appt [facility].

## 2017-08-17 ENCOUNTER — Other Ambulatory Visit (HOSPITAL_COMMUNITY): Payer: Self-pay

## 2017-09-28 NOTE — Telephone Encounter (Signed)
In error

## 2017-11-22 DIAGNOSIS — M67832 Other specified disorders of synovium, left wrist: Secondary | ICD-10-CM | POA: Diagnosis not present

## 2018-09-18 ENCOUNTER — Encounter: Payer: Self-pay | Admitting: Gastroenterology

## 2018-09-19 ENCOUNTER — Telehealth: Payer: Self-pay | Admitting: Internal Medicine

## 2018-09-19 NOTE — Telephone Encounter (Signed)
Copied from [place] [phone]. Topic: Appointment Scheduling - Scheduling Inquiry for Clinic >> Sep 19, 2018 12:44 PM Antonieta Iba C wrote: Reason for CRM: Called in to schedule a np apt with Dr. Alain Marion. Pt says that provider is the only provider in [place] that speaks [place] and pt speaks [place] fluently. Pt says that he needs the providers assistance with getting set up with a therapist..    Please advise/assist  CB:   [phone]

## 2018-09-22 NOTE — Telephone Encounter (Signed)
Ok Thx 

## 2018-09-25 ENCOUNTER — Encounter: Payer: Self-pay | Admitting: Gastroenterology

## 2018-09-25 ENCOUNTER — Other Ambulatory Visit (INDEPENDENT_AMBULATORY_CARE_PROVIDER_SITE_OTHER): Payer: BLUE CROSS/BLUE SHIELD

## 2018-09-25 ENCOUNTER — Ambulatory Visit: Payer: BLUE CROSS/BLUE SHIELD | Admitting: Gastroenterology

## 2018-09-25 ENCOUNTER — Encounter (INDEPENDENT_AMBULATORY_CARE_PROVIDER_SITE_OTHER): Payer: Self-pay

## 2018-09-25 VITALS — BP 126/78 | HR 76 | Ht 68.75 in | Wt 165.0 lb

## 2018-09-25 DIAGNOSIS — K7460 Unspecified cirrhosis of liver: Secondary | ICD-10-CM

## 2018-09-25 LAB — COMPREHENSIVE METABOLIC PANEL
ALBUMIN: 4.8 g/dL (ref 3.5–5.2)
ALT: 32 U/L (ref 0–53)
AST: 21 U/L (ref 0–37)
Alkaline Phosphatase: 65 U/L (ref 39–117)
BUN: 11 mg/dL (ref 6–23)
CHLORIDE: 99 meq/L (ref 96–112)
CO2: 29 mEq/L (ref 19–32)
Calcium: 10.1 mg/dL (ref 8.4–10.5)
Creatinine, Ser: 0.81 mg/dL (ref 0.40–1.50)
GFR: 108.67 mL/min (ref >60.00)
Glucose, Bld: 96 mg/dL (ref 70–99)
Potassium: 4 mEq/L (ref 3.5–5.1)
Sodium: 138 mEq/L (ref 135–145)
Total Bilirubin: 0.6 mg/dL (ref 0.2–1.2)
Total Protein: 8.1 g/dL (ref 6.0–8.3)

## 2018-09-25 LAB — CBC WITH DIFFERENTIAL/PLATELET
Basophils Absolute: 0 10*3/uL (ref 0.0–0.1)
Basophils Relative: 0.7 % (ref 0.0–3.0)
Eosinophils Absolute: 0 10*3/uL (ref 0.0–0.7)
Eosinophils Relative: 0.5 % (ref 0.0–5.0)
HCT: 43.7 % (ref 39.0–52.0)
Hemoglobin: 14.2 g/dL (ref 13.0–17.0)
Lymphocytes Relative: 30.9 % (ref 12.0–46.0)
Lymphs Abs: 1.3 10*3/uL (ref 0.7–4.0)
MCHC: 32.5 g/dL (ref 30.0–36.0)
MCV: 72.9 fl — ABNORMAL LOW (ref 78.0–100.0)
MONOS PCT: 10.5 % (ref 3.0–12.0)
Monocytes Absolute: 0.5 10*3/uL (ref 0.1–1.0)
Neutro Abs: 2.5 10*3/uL (ref 1.4–7.7)
Neutrophils Relative %: 57.4 % (ref 43.0–77.0)
Platelets: 274 10*3/uL (ref 150.0–400.0)
RBC: 6 Mil/uL — ABNORMAL HIGH (ref 4.22–5.81)
RDW: 13.9 % (ref 11.5–15.5)
WBC: 4.3 10*3/uL (ref 4.0–10.5)

## 2018-09-25 LAB — TSH: TSH: 0.07 u[IU]/mL — ABNORMAL LOW (ref 0.35–4.50)

## 2018-09-25 LAB — PROTIME-INR
INR: 1.2 ratio — ABNORMAL HIGH (ref 0.8–1.0)
Prothrombin Time: 14.1 s — ABNORMAL HIGH (ref 9.6–13.1)

## 2018-09-25 MED ORDER — LEVOTHYROXINE SODIUM 125 MCG PO TABS: 125.0000 ug | ORAL_TABLET | Freq: Every day | ORAL | 0 refills | Status: DC

## 2018-09-25 NOTE — Progress Notes (Signed)
HPI: This is a very pleasant 47 year old man whose first language is [place].  He speaks English quite well however.  He was told he had hepatitis B at least 25 years ago.  He is not sure where he caught it but he said many people that he lives without it.  He was seen by gastroenterology at [facility] in [place] and had numerous blood tests, serial ultrasounds over several years from about 2013 until 2017.  He brought his phone with him which had access to a lot of his previous records.  He has had no blood tests or imaging studies since at least 2018.  He does recall that 15 or 20 years ago he gave himself shots it sounds like he was on interferon for a while for his hepatitis B.  His father had renal cancer.  Mother had stomach cancer.  Grandmother had breast cancer  2014 Hep B e antibody + 2013 Hep C Ab negative, Hep B surface Ab negative, Hep B Surface Ag +  09/2016 [place] stable 60mm hyperechoic right liver lesion (stable vs 2013 [place])  Chief complaint is chronic hepatitis B, he also asked for refills of Synthroid and endocrinology referral.  ROS: complete GI ROS as described in HPI, all other review negative.  Constitutional:  No unintentional weight loss   Past Medical History:  Diagnosis Date  . Hemorrhoids   . Hepatitis B   . Prediabetes   . Thyroid cancer [facility])     Past Surgical History:  Procedure Laterality Date  . HEMORRHOID SURGERY    . TOTAL THYROIDECTOMY      Current Outpatient Medications  Medication Sig Dispense Refill  . levothyroxine (SYNTHROID, LEVOTHROID) 125 MCG tablet Take 125 mcg by mouth daily before breakfast.     No current facility-administered medications for this visit.     Allergies as of 09/25/2018  . (No Known Allergies)    Family History  Problem Relation Age of Onset  . Heart disease Mother   . Stomach cancer Mother   . Heart attack Father   . Kidney cancer Father        mets   . Diabetes Father   . Breast cancer  Paternal Grandfather        mets  . Diabetes Sister     Social History   Socioeconomic History  . Marital status: Single    Spouse name: Not on file  . Number of children: 3  . Years of education: Not on file  . Highest education level: Not on file  Occupational History  . Occupation: Airline pilot  Social Needs  . Financial resource strain: Not on file  . Food insecurity:    Worry: Not on file    Inability: Not on file  . Transportation needs:    Medical: Not on file    Non-medical: Not on file  Tobacco Use  . Smoking status: Former Smoker    Types: Cigarettes    Last attempt to quit: 09/25/2005    Years since quitting: 13.0  . Smokeless tobacco: Never Used  Substance and Sexual Activity  . Alcohol use: Yes    Comment: occasional  . Drug use: Never  . Sexual activity: Not on file  Lifestyle  . Physical activity:    Days per week: Not on file    Minutes per session: Not on file  . Stress: Not on file  Relationships  . Social connections:    Talks on phone: Not on  file    Gets together: Not on file    Attends religious service: Not on file    Active member of club or organization: Not on file    Attends meetings of clubs or organizations: Not on file    Relationship status: Not on file  . Intimate partner violence:    Fear of current or ex partner: Not on file    Emotionally abused: Not on file    Physically abused: Not on file    Forced sexual activity: Not on file  Other Topics Concern  . Not on file  Social History Narrative  . Not on file    Physical Exam: BP 126/78 (BP Location: Left Arm, Patient Position: Sitting, Cuff Size: Normal)   Pulse 76   Ht 5' 8.75" (1.746 m) Comment: height measured without shoes  Wt 165 lb (74.8 kg)   BMI 24.54 kg/m  Constitutional: generally well-appearing Psychiatric: alert and oriented x3 Eyes: extraocular movements intact Mouth: oral pharynx moist, no lesions Neck: supple no lymphadenopathy Cardiovascular: heart  regular rate and rhythm Lungs: clear to auscultation bilaterally Abdomen: soft, nontender, nondistended, no obvious ascites, no peritoneal signs, normal bowel sounds Extremities: no lower extremity edema bilaterally Skin: no lesions on visible extremities    Assessment and plan: 47 y.o. male with chronic hepatitis B  We will [facility]Out to [facility] for blood tests and ultrasound reports.  His last testing of any kind there was at least 2 years ago and so he needs a repeat set of labs and also liver ultrasound.  See the labs in patient instructions.  He has no clinical signs of cirrhosis fortunately.  He also asked about refills for Synthroid.  He is almost out.  He has no thyroid after thyroidectomy several years ago.  I told him I was happy to give him a 2 or 22-month supply of the medicine.  He has been on a stable dose for many many years and I will call him and that specific dose.  I will also arrange for endocrinology referral.  He knows I am not comfortable managing his thyroid any further after that 3 months.  He asked for help getting in with Russian-speaking primary care physician here at [place], Dr. Alain Marion and I am happy to help with that.  Please see the "Patient Instructions" section for addition details about the plan.  Owens Loffler, MD [facility] 09/25/2018, 1:29 PM

## 2018-09-25 NOTE — Telephone Encounter (Signed)
LVM for patient to inform him Dr.Plotnikov will accept him.   When making the appointment the TYPE needs to be OV and in the comment NEW PATIENT/INSURANCE.   Then add to the TE the appointment date and send back to office and will change the type to NP.

## 2018-09-25 NOTE — Patient Instructions (Addendum)
You will have labs checked today in the basement lab.  Please head down after you check out with the front desk  (hepatitis B Surface Antigen, Hepatitis B surface Antibody, Hepatitis B viral load, Hepatitis B e Antigen, cmet, inr, CBC, AFP, Hepatitis A IgG, Hepatitis C antibody, HIV. TSH level)  You will be set up for an ultrasound for chronic hepatitis B.  Pending the results of the above tests, you may be referred to [facility] here in [place].  We will get records sent from your previous gastroenterologist in [place] for review.  [facility] tests.  Synthroid 12mcg tablet, one pill once daily, disp 90 pills, no refills.  Will check on appt with Dr. Alain Marion.  Referral to endocrinology for history of thyroid cancer, s/p completed thyroidectomy.  You have been scheduled for an abdominal ultrasound at [facility] (1st floor of hospital) on 10/05/18 at 9am. Please arrive 15 minutes prior to your appointment for registration. Make certain not to have anything to eat or drink 6 hours prior to your appointment. Should you need to reschedule your appointment, please contact radiology at [phone]. This test typically takes about 30 minutes to perform.

## 2018-09-25 NOTE — Telephone Encounter (Signed)
Appointment has been made for 10/31/18

## 2018-09-26 ENCOUNTER — Telehealth: Payer: Self-pay | Admitting: Gastroenterology

## 2018-09-26 NOTE — Telephone Encounter (Signed)
Upper endoscopy March 2013 at [facility] in [place].  Indication "heartburn, family history of gastric cancer, history of thyroid cancer".  Findings mild reflux esophagitis, gastritis that was biopsied.  Normal duodenum.  Path report shows H. pylori was present.  Blood work July 2017 normal complete metabolic profile including normal liver tests.  Alpha-fetoprotein.  Hepatitis B quantitative level 196 IU/mL

## 2018-09-27 ENCOUNTER — Other Ambulatory Visit: Payer: Self-pay | Admitting: Gastroenterology

## 2018-09-27 DIAGNOSIS — R1310 Dysphagia, unspecified: Secondary | ICD-10-CM

## 2018-09-27 DIAGNOSIS — K7460 Unspecified cirrhosis of liver: Secondary | ICD-10-CM

## 2018-09-27 DIAGNOSIS — R109 Unspecified abdominal pain: Secondary | ICD-10-CM

## 2018-09-27 LAB — AFP TUMOR MARKER: AFP-Tumor Marker: 1.9 ng/mL (ref <6.1)

## 2018-09-27 LAB — HEPATITIS B SURFACE ANTIBODY,QUALITATIVE: Hep B S Ab: NONREACTIVE

## 2018-09-27 LAB — HEPATITIS C ANTIBODY
Hepatitis C Ab: NONREACTIVE
SIGNAL TO CUT-OFF: 0.04 (ref <1.00)

## 2018-09-27 LAB — HEPATITIS A ANTIBODY, TOTAL: Hepatitis A AB,Total: REACTIVE — AB

## 2018-09-27 LAB — HEPATITIS B SURFACE ANTIGEN
Confirmation: REACTIVE — AB
Hepatitis B Surface Ag: REACTIVE — AB

## 2018-10-02 ENCOUNTER — Ambulatory Visit: Payer: BLUE CROSS/BLUE SHIELD | Admitting: Internal Medicine

## 2018-10-02 ENCOUNTER — Other Ambulatory Visit: Payer: Self-pay

## 2018-10-02 DIAGNOSIS — R109 Unspecified abdominal pain: Secondary | ICD-10-CM

## 2018-10-02 DIAGNOSIS — K7460 Unspecified cirrhosis of liver: Secondary | ICD-10-CM

## 2018-10-05 ENCOUNTER — Other Ambulatory Visit: Payer: BLUE CROSS/BLUE SHIELD

## 2018-10-05 ENCOUNTER — Ambulatory Visit (HOSPITAL_COMMUNITY)
Admission: RE | Admit: 2018-10-05 | Discharge: 2018-10-05 | Disposition: A | Discharge status: Home / Self Care | Payer: BLUE CROSS/BLUE SHIELD | Source: Ambulatory Visit | Attending: Gastroenterology | Admitting: Gastroenterology

## 2018-10-05 DIAGNOSIS — R109 Unspecified abdominal pain: Secondary | ICD-10-CM | POA: Diagnosis not present

## 2018-10-05 DIAGNOSIS — K7460 Unspecified cirrhosis of liver: Secondary | ICD-10-CM

## 2018-10-06 ENCOUNTER — Ambulatory Visit: Payer: BLUE CROSS/BLUE SHIELD | Admitting: Endocrinology

## 2018-10-06 LAB — HEPATITIS B E ANTIBODY: Hep B E Ab: REACTIVE — AB

## 2018-10-06 LAB — HEPATITIS B E ANTIGEN: Hep B E Ag: NONREACTIVE

## 2018-10-31 ENCOUNTER — Encounter: Payer: Self-pay | Admitting: Internal Medicine

## 2018-10-31 ENCOUNTER — Ambulatory Visit: Payer: BLUE CROSS/BLUE SHIELD | Admitting: Internal Medicine

## 2018-10-31 ENCOUNTER — Encounter (INDEPENDENT_AMBULATORY_CARE_PROVIDER_SITE_OTHER): Payer: Self-pay

## 2018-10-31 DIAGNOSIS — C73 Malignant neoplasm of thyroid gland: Secondary | ICD-10-CM

## 2018-10-31 DIAGNOSIS — D485 Neoplasm of uncertain behavior of skin: Secondary | ICD-10-CM | POA: Diagnosis not present

## 2018-10-31 DIAGNOSIS — B181 Chronic viral hepatitis B without delta-agent: Secondary | ICD-10-CM | POA: Insufficient documentation

## 2018-10-31 DIAGNOSIS — L308 Other specified dermatitis: Secondary | ICD-10-CM | POA: Diagnosis not present

## 2018-10-31 DIAGNOSIS — Z85850 Personal history of malignant neoplasm of thyroid: Secondary | ICD-10-CM | POA: Insufficient documentation

## 2018-10-31 DIAGNOSIS — B351 Tinea unguium: Secondary | ICD-10-CM

## 2018-10-31 DIAGNOSIS — R5383 Other fatigue: Secondary | ICD-10-CM | POA: Insufficient documentation

## 2018-10-31 DIAGNOSIS — Z8249 Family history of ischemic heart disease and other diseases of the circulatory system: Secondary | ICD-10-CM | POA: Diagnosis not present

## 2018-10-31 DIAGNOSIS — E890 Postprocedural hypothyroidism: Secondary | ICD-10-CM

## 2018-10-31 DIAGNOSIS — L309 Dermatitis, unspecified: Secondary | ICD-10-CM | POA: Insufficient documentation

## 2018-10-31 DIAGNOSIS — R739 Hyperglycemia, unspecified: Secondary | ICD-10-CM | POA: Insufficient documentation

## 2018-10-31 DIAGNOSIS — R5382 Chronic fatigue, unspecified: Secondary | ICD-10-CM

## 2018-10-31 DIAGNOSIS — E039 Hypothyroidism, unspecified: Secondary | ICD-10-CM | POA: Insufficient documentation

## 2018-10-31 MED ORDER — VITAMIN D3 50 MCG (2000 UT) PO CAPS: 2000.0000 [IU] | ORAL_CAPSULE | Freq: Every day | ORAL | 3 refills | Status: AC

## 2018-10-31 MED ORDER — VITAMIN D3 50 MCG (2000 UT) PO CAPS: 2000.0000 [IU] | ORAL_CAPSULE | Freq: Every day | ORAL | 3 refills | Status: DC

## 2018-10-31 MED ORDER — CLOBETASOL PROPIONATE 0.05 % EX CREA: 1.0000 "application " | TOPICAL_CREAM | Freq: Two times a day (BID) | CUTANEOUS | 3 refills | Status: DC

## 2018-10-31 MED ORDER — CICLOPIROX 8 % EX SOLN: Freq: Every day | CUTANEOUS | 0 refills | Status: DC

## 2018-10-31 MED ORDER — LORATADINE 10 MG PO TABS: 10.0000 mg | ORAL_TABLET | Freq: Every day | ORAL | 11 refills | Status: DC

## 2018-10-31 MED ORDER — B COMPLEX PO TABS: 1.0000 | ORAL_TABLET | Freq: Every day | ORAL | 3 refills | Status: DC

## 2018-10-31 NOTE — Progress Notes (Signed)
Subjective:  Patient ID: Allen Ellis, male    DOB: 02/03/1971  Age: 48 y.o. MRN: [id number]  CC: No chief complaint on file.   HPI Allen Ellis presents for a new pt visit.  He needs to address multiple medical problems including eczema of his hands and ear has been bothering him for a long time.  It seems to be worse after consuming bread and makes him think that is related to yeast consumption.  He has good days and bad days with that.  Clobetasol seems to help a little.    He complains of being hypothyroid after thyroidectomy of probably 5 years ago due to thyroid cancer.  He is on thyroid replacement therapy  She complains of being tired.  He has a history of hepatitis B.  He started to see Dr. Ardis Hughs  She was diagnosed with borderline diabetes at his recent health screening at work.  He has an appointment to see Dr. Loanne Drilling tomorrow  She complains of hair loss  His parents died young of cancer.  Both parents had coronary artery disease.  His dad had a heart attack in his early 68s.  Vision - worse lately   Outpatient Medications Prior to Visit  Medication Sig Dispense Refill  . levothyroxine (SYNTHROID, LEVOTHROID) 125 MCG tablet Take 125 mcg by mouth daily before breakfast. 1/2 tab on Sunday    . levothyroxine (SYNTHROID) 125 MCG tablet Take 1 tablet (125 mcg total) by mouth daily. 90 tablet 0   No facility-administered medications prior to visit.     ROS: Review of Systems  Constitutional: Positive for fatigue. Negative for appetite change and unexpected weight change.  HENT: Negative for congestion, nosebleeds, sneezing, sore throat and trouble swallowing.   Eyes: Negative for itching and visual disturbance.  Respiratory: Negative for cough.   Cardiovascular: Negative for chest pain, palpitations and leg swelling.  Gastrointestinal: Negative for abdominal distention, blood in stool, diarrhea and nausea.  Genitourinary: Negative for frequency and hematuria.    Musculoskeletal: Negative for back pain, gait problem, joint swelling and neck pain.  Skin: Positive for rash.  Neurological: Negative for dizziness, tremors, speech difficulty and weakness.  Psychiatric/Behavioral: Negative for agitation, dysphoric mood, sleep disturbance and suicidal ideas. The patient is not nervous/anxious.     Objective:  BP 128/80 (BP Location: Left Arm, Patient Position: Sitting, Cuff Size: Normal)   Pulse (!) 59   Temp 97.9 F (36.6 C) (Oral)   Ht 5' 8.75" (1.746 m)   Wt 169 lb (76.7 kg)   SpO2 99%   BMI 25.14 kg/m   BP Readings from Last 3 Encounters:  10/31/18 128/80  09/25/18 126/78  07/14/17 118/86    Wt Readings from Last 3 Encounters:  10/31/18 169 lb (76.7 kg)  09/25/18 165 lb (74.8 kg)  07/14/17 169 lb (76.7 kg)    Physical Exam Constitutional:      General: He is not in acute distress.    Appearance: He is well-developed.     Comments: NAD  Eyes:     Conjunctiva/sclera: Conjunctivae normal.     Pupils: Pupils are equal, round, and reactive to light.  Neck:     Musculoskeletal: Normal range of motion.     Thyroid: No thyromegaly.     Vascular: No JVD.  Cardiovascular:     Rate and Rhythm: Normal rate and regular rhythm.     Heart sounds: Normal heart sounds. No murmur. No friction rub. No gallop.   Pulmonary:  Effort: Pulmonary effort is normal. No respiratory distress.     Breath sounds: Normal breath sounds. No wheezing or rales.  Chest:     Chest wall: No tenderness.  Abdominal:     General: Bowel sounds are normal. There is no distension.     Palpations: Abdomen is soft. There is no mass.     Tenderness: There is no abdominal tenderness. There is no guarding or rebound.  Musculoskeletal: Normal range of motion.        General: No tenderness.  Lymphadenopathy:     Cervical: No cervical adenopathy.  Skin:    General: Skin is warm and dry.     Findings: No rash.  Neurological:     Mental Status: He is alert and  oriented to person, place, and time.     Cranial Nerves: No cranial nerve deficit.     Motor: No abnormal muscle tone.     Coordination: Coordination normal.     Gait: Gait normal.     Deep Tendon Reflexes: Reflexes are normal and symmetric.  Psychiatric:        Behavior: Behavior normal.        Thought Content: Thought content normal.        Judgment: Judgment normal.   palm w/eczema Mole L chest  Toe w/onycho   A complex case    Lab Results  Component Value Date   WBC 4.3 09/25/2018   HGB 14.2 09/25/2018   HCT 43.7 09/25/2018   PLT 274.0 09/25/2018   GLUCOSE 96 09/25/2018   ALT 32 09/25/2018   AST 21 09/25/2018   NA 138 09/25/2018   K 4.0 09/25/2018   CL 99 09/25/2018   CREATININE 0.81 09/25/2018   BUN 11 09/25/2018   CO2 29 09/25/2018   TSH 0.07 (L) 09/25/2018   INR 1.2 (H) 09/25/2018    US Abdomen Complete  Result Date: 10/05/2018 CLINICAL DATA:  Chronic hepatitis with cirrhosis EXAM: ABDOMEN ULTRASOUND COMPLETE COMPARISON:  None. FINDINGS: Gallbladder: No gallstones or wall thickening visualized. There is no pericholecystic fluid. No sonographic Murphy sign noted by sonographer. Common bile duct: Diameter: 2 mm. No intrahepatic, common hepatic, or common bile duct dilatation. Liver: No focal lesion identified. Within normal limits in parenchymal echogenicity. Portal vein is patent on color Doppler imaging with normal direction of blood flow towards the liver. IVC: No abnormality visualized. Pancreas: Visualized portion unremarkable. Portions of pancreas obscured by gas. Spleen: Size and appearance within normal limits. Right Kidney: Length: 10.7 cm. Echogenicity within normal limits. No mass or hydronephrosis visualized. Left Kidney: Length: 11.0 cm. Echogenicity within normal limits. No mass or hydronephrosis visualized. Abdominal aorta: No aneurysm visualized. Other findings: No demonstrable ascites. IMPRESSION: 1. Portions of pancreas obscured by gas. Visualized  portions of pancreas appear normal. 2. No appreciable liver lesions. Liver echogenicity within normal limits. 3.  Study otherwise unremarkable. Electronically Signed   By: Lowella Grip III M.D.   On: 10/05/2018 11:29    Assessment & Plan:   There are no diagnoses linked to this encounter.   No orders of the defined types were placed in this encounter.    Follow-up: No follow-ups on file.  Walker Kehr, MD

## 2018-10-31 NOTE — Patient Instructions (Signed)
  Gluten free trial for 4-6 weeks. OK to use gluten-free bread and gluten-free pasta.    Gluten-Free Diet for Celiac Disease, Adult The gluten-free diet includes all foods that do not contain gluten. Gluten is a protein that is found in wheat, rye, barley, and some other grains. Following the gluten-free diet is the only treatment for people with celiac disease. It helps to prevent damage to the intestines and improves or eliminates the symptoms of celiac disease. Following the gluten-free diet requires some planning. It can be challenging at first, but it gets easier with time and practice. There are more gluten-free options available today than ever before. If you need help finding gluten-free foods or if you have questions, talk with your diet and nutrition specialist (registered dietitian) or your health care provider. What do I need to know about a gluten-free diet?  All fruits, vegetables, and meats are safe to eat and do not contain gluten.  When grocery shopping, start by shopping in the produce, meat, and dairy sections. These sections are more likely to contain gluten-free foods. Then move to the aisles that contain packaged foods if you need to.  Read all food labels. Gluten is often added to foods. Always check the ingredient list and look for warnings, such as "may contain gluten."  Talk with your dietitian or health care provider before taking a gluten-free multivitamin or mineral supplement.  Be aware of gluten-free foods having contact with foods that contain gluten (cross-contamination). This can happen at home and with any processed foods. ? Talk with your health care provider or dietitian about how to reduce the risk of cross-contamination in your home. ? If you have questions about how a food is processed, ask the manufacturer. What key words help to identify gluten? Foods that list any of these key words on the label usually contain gluten:  Wheat, flour, enriched  flour, bromated flour, white flour, durum flour, graham flour, phosphated flour, self-rising flour, semolina, farina, barley (malt), rye, and oats.  Starch, dextrin, modified food starch, or cereal.  Thickening, fillers, or emulsifiers.  Malt flavoring, malt extract, or malt syrup.  Hydrolyzed vegetable protein.  In the [place] packaged foods that are gluten-free are required to be labeled "GF." These foods should be easy to identify and are safe to eat. In the [place] food companies are also required to list common food allergens, including wheat, on their labels. Recommended foods Grains  Amaranth, bean flours, 100% buckwheat flour, corn, millet, nut flours or nut meals, GF oats, quinoa, rice, sorghum, teff, rice wafers, pure cornmeal tortillas, popcorn, and hot cereals made from cornmeal. Hominy, rice, wild rice. Some Asian rice noodles or bean noodles. Arrowroot starch, corn bran, corn flour, corn germ, cornmeal, corn starch, potato flour, potato starch flour, and rice bran. Plain, brown, and sweet rice flours. Rice polish, soy flour, and tapioca starch. Vegetables  All plain fresh, frozen, and canned vegetables. Fruits  All plain fresh, frozen, canned, and dried fruits, and 100% fruit juices. Meats and other protein foods  All fresh beef, pork, poultry, fish, seafood, and eggs. Fish canned in water, oil, brine, or vegetable broth. Plain nuts and seeds, peanut butter. Some lunch meat and some frankfurters. Dried beans, dried peas, and lentils. Dairy  Fresh plain, dry, evaporated, or condensed milk. Cream, butter, sour cream, whipping cream, and most yogurts. Unprocessed cheese, most processed cheeses, some cottage cheese, some cream cheeses. Beverages  Coffee, tea, most herbal teas. Carbonated beverages and some root beers.   Wine, sake, and distilled spirits, such as gin, vodka, and whiskey. Most hard ciders. Fats and oils  Butter, margarine, vegetable oil, hydrogenated butter, olive  oil, shortening, lard, cream, and some mayonnaise. Some commercial salad dressings. Olives. Sweets and desserts  Sugar, honey, some syrups, molasses, jelly, and jam. Plain hard candy, marshmallows, and gumdrops. Pure cocoa powder. Plain chocolate. Custard and some pudding mixes. Gelatin desserts, sorbets, frozen ice pops, and sherbet. Cake, cookies, and other desserts prepared with allowed flours. Some commercial ice creams. Cornstarch, tapioca, and rice puddings. Seasoning and other foods  Some canned or frozen soups. Monosodium glutamate (MSG). Cider, rice, and wine vinegar. Baking soda and baking powder. Cream of tartar. Baking and nutritional yeast. Certain soy sauces made without wheat (ask your dietitian about specific brands that are allowed). Nuts, coconut, and chocolate. Salt, pepper, herbs, spices, flavoring extracts, imitation or artificial flavorings, natural flavorings, and food colorings. Some medicines and supplements. Some lip glosses and other cosmetics. Rice syrups. The items listed may not be a complete list. Talk with your dietitian about what dietary choices are best for you. Foods to avoid Grains  Barley, bran, bulgur, couscous, cracked wheat, , farro, graham, malt, matzo, semolina, wheat germ, and all wheat and rye cereals including spelt and kamut. Cereals containing malt as a flavoring, such as rice cereal. Noodles, spaghetti, macaroni, most packaged rice mixes, and all mixes containing wheat, rye, barley, or triticale. Vegetables  Most creamed vegetables and most vegetables canned in sauces. Some commercially prepared vegetables and salads. Fruits  Thickened or prepared fruits and some pie fillings. Some fruit snacks and fruit roll-ups. Meats and other protein foods  Any meat or meat alternative containing wheat, rye, barley, or gluten stabilizers. These are often marinated or packaged meats and lunch meats. Bread-containing products, such as Swiss steak,  croquettes, meatballs, and meatloaf. Most tuna canned in vegetable broth and turkey with hydrolyzed vegetable protein (HVP) injected as part of the basting. Seitan. Imitation fish. Eggs in sauces made from ingredients to avoid. Dairy  Commercial chocolate milk drinks and malted milk. Some non-dairy creamers. Any cheese product containing ingredients to avoid. Beverages  Certain cereal beverages. Beer, ale, malted milk, and some root beers. Some hard ciders. Some instant flavored coffees. Some herbal teas made with barley or with barley malt added. Fats and oils  Some commercial salad dressings. Sour cream containing modified food starch. Sweets and desserts  Some toffees. Chocolate-coated nuts (may be rolled in wheat flour) and some commercial candies and candy bars. Most cakes, cookies, donuts, pastries, and other baked goods. Some commercial ice cream. Ice cream cones. Commercially prepared mixes for cakes, cookies, and other desserts. Bread pudding and other puddings thickened with flour. Products containing brown rice syrup made with barley malt enzyme. Desserts and sweets made with malt flavoring. Seasoning and other foods  Some curry powders, some dry seasoning mixes, some gravy extracts, some meat sauces, some ketchups, some prepared mustards, and horseradish. Certain soy sauces. Malt vinegar. Bouillon and bouillon cubes that contain HVP. Some chip dips, and some chewing gum. Yeast extract. Brewer's yeast. Caramel color. Some medicines and supplements. Some lip glosses and other cosmetics. The items listed may not be a complete list. Talk with your dietitian about what dietary choices are best for you. Summary  Gluten is a protein that is found in wheat, rye, barley, and some other grains. The gluten-free diet includes all foods that do not contain gluten.  If you need help finding gluten-free foods or if   you have questions, talk with your diet and nutrition specialist (registered  dietitian) or your health care provider.  Read all food labels. Gluten is often added to foods. Always check the ingredient list and look for warnings, such as "may contain gluten." This information is not intended to replace advice given to you by your health care provider. Make sure you discuss any questions you have with your health care provider. Document Released: 10/04/2005 Document Revised: 07/19/2016 Document Reviewed: 07/19/2016 Elsevier Interactive Patient Education  2018 [place].      Skin bx w/me  Cardiac CT calcium scoring test $150   Computed tomography, more commonly known as a CT or CAT scan, is a diagnostic medical imaging test. Like traditional x-rays, it produces multiple images or pictures of the inside of the body. The cross-sectional images generated during a CT scan can be reformatted in multiple planes. They can even generate three-dimensional images. These images can be viewed on a computer monitor, printed on film or by a 3D printer, or transferred to a CD or DVD. CT images of internal organs, bones, soft tissue and blood vessels provide greater detail than traditional x-rays, particularly of soft tissues and blood vessels. A cardiac CT scan for coronary calcium is a non-invasive way of obtaining information about the presence, location and extent of calcified plaque in the coronary arteries-the vessels that supply oxygen-containing blood to the heart muscle. Calcified plaque results when there is a build-up of fat and other substances under the inner layer of the artery. This material can calcify which signals the presence of atherosclerosis, a disease of the vessel wall, also called coronary artery disease (CAD). People with this disease have an increased risk for heart attacks. In addition, over time, progression of plaque build up (CAD) can narrow the arteries or even close off blood flow to the heart. The result may be chest pain, sometimes called "angina," or  a heart attack. Because calcium is a marker of CAD, the amount of calcium detected on a cardiac CT scan is a helpful prognostic tool. The findings on cardiac CT are expressed as a calcium score. Another name for this test is coronary artery calcium scoring.  What are some common uses of the procedure? The goal of cardiac CT scan for calcium scoring is to determine if CAD is present and to what extent, even if there are no symptoms. It is a screening study that may be recommended by a physician for patients with risk factors for CAD but no clinical symptoms. The major risk factors for CAD are: . high blood cholesterol levels  . family history of heart attacks  . diabetes  . high blood pressure  . cigarette smoking  . overweight or obese  . physical inactivity   A negative cardiac CT scan for calcium scoring shows no calcification within the coronary arteries. This suggests that CAD is absent or so minimal it cannot be seen by this technique. The chance of having a heart attack over the next two to five years is very low under these circumstances. A positive test means that CAD is present, regardless of whether or not the patient is experiencing any symptoms. The amount of calcification-expressed as the calcium score-may help to predict the likelihood of a myocardial infarction (heart attack) in the coming years and helps your medical doctor or cardiologist decide whether the patient may need to take preventive medicine or undertake other measures such as diet and exercise to lower the risk for heart  attack. The extent of CAD is graded according to your calcium score:  Calcium Score  Presence of CAD  0 No evidence of CAD   1-10 Minimal evidence of CAD  11-100 Mild evidence of CAD  101-400 Moderate evidence of CAD  Over 400 Extensive evidence of CAD

## 2018-10-31 NOTE — Assessment & Plan Note (Signed)
[place]  

## 2018-10-31 NOTE — Assessment & Plan Note (Signed)
S/p thyroidectomy 2015

## 2018-10-31 NOTE — Assessment & Plan Note (Signed)
Per Dr Ardis Hughs: 48 y.o. male with chronic hepatitis B  [facility] and ultrasound reports.  His last testing of any kind there was at least 2 years ago and so he needs a repeat set of labs and also liver ultrasound.  See the labs in patient instructions.  He has no clinical signs of cirrhosis fortunately.

## 2018-10-31 NOTE — Assessment & Plan Note (Signed)
Labs Claritin, Clobetasol Gluten free diet

## 2018-10-31 NOTE — Assessment & Plan Note (Signed)
Labs including testosterone

## 2018-10-31 NOTE — Assessment & Plan Note (Addendum)
Skin bx w/me 

## 2018-10-31 NOTE — Assessment & Plan Note (Signed)
Labs

## 2018-10-31 NOTE — Assessment & Plan Note (Signed)
Labs Dr Loanne Drilling appt tomorrow

## 2018-10-31 NOTE — Assessment & Plan Note (Addendum)
Baby ASA CT ca scoring suggested

## 2018-11-01 ENCOUNTER — Ambulatory Visit: Payer: BLUE CROSS/BLUE SHIELD | Admitting: Endocrinology

## 2018-11-01 ENCOUNTER — Other Ambulatory Visit: Payer: Self-pay | Admitting: Internal Medicine

## 2018-11-01 ENCOUNTER — Other Ambulatory Visit (INDEPENDENT_AMBULATORY_CARE_PROVIDER_SITE_OTHER): Payer: BLUE CROSS/BLUE SHIELD

## 2018-11-01 ENCOUNTER — Encounter: Payer: Self-pay | Admitting: Endocrinology

## 2018-11-01 VITALS — BP 124/82 | HR 75 | Ht 68.0 in | Wt 170.6 lb

## 2018-11-01 DIAGNOSIS — L308 Other specified dermatitis: Secondary | ICD-10-CM | POA: Diagnosis not present

## 2018-11-01 DIAGNOSIS — R739 Hyperglycemia, unspecified: Secondary | ICD-10-CM | POA: Diagnosis not present

## 2018-11-01 DIAGNOSIS — C73 Malignant neoplasm of thyroid gland: Secondary | ICD-10-CM

## 2018-11-01 DIAGNOSIS — E890 Postprocedural hypothyroidism: Secondary | ICD-10-CM

## 2018-11-01 LAB — URINALYSIS
Bilirubin Urine: NEGATIVE
Ketones, ur: NEGATIVE
Leukocytes, UA: NEGATIVE
Nitrite: NEGATIVE
Specific Gravity, Urine: 1.02 (ref 1.000–1.030)
Total Protein, Urine: NEGATIVE
Urine Glucose: NEGATIVE
Urobilinogen, UA: 0.2 (ref 0.0–1.0)
pH: 5.5 (ref 5.0–8.0)

## 2018-11-01 LAB — HEPATIC FUNCTION PANEL
ALK PHOS: 62 U/L (ref 39–117)
ALT: 36 U/L (ref 0–53)
AST: 22 U/L (ref 0–37)
Albumin: 4.5 g/dL (ref 3.5–5.2)
Bilirubin, Direct: 0.1 mg/dL (ref 0.0–0.3)
Total Bilirubin: 0.4 mg/dL (ref 0.2–1.2)
Total Protein: 7.3 g/dL (ref 6.0–8.3)

## 2018-11-01 LAB — CBC WITH DIFFERENTIAL/PLATELET
Basophils Absolute: 0 10*3/uL (ref 0.0–0.1)
Basophils Relative: 0.8 % (ref 0.0–3.0)
Eosinophils Absolute: 0.2 10*3/uL (ref 0.0–0.7)
Eosinophils Relative: 3.2 % (ref 0.0–5.0)
HEMATOCRIT: 43.2 % (ref 39.0–52.0)
HEMOGLOBIN: 13.9 g/dL (ref 13.0–17.0)
Lymphocytes Relative: 35.7 % (ref 12.0–46.0)
Lymphs Abs: 1.7 10*3/uL (ref 0.7–4.0)
MCHC: 32.1 g/dL (ref 30.0–36.0)
MCV: 73.5 fl — ABNORMAL LOW (ref 78.0–100.0)
Monocytes Absolute: 0.5 10*3/uL (ref 0.1–1.0)
Monocytes Relative: 10.6 % (ref 3.0–12.0)
Neutro Abs: 2.4 10*3/uL (ref 1.4–7.7)
Neutrophils Relative %: 49.7 % (ref 43.0–77.0)
Platelets: 279 10*3/uL (ref 150.0–400.0)
RBC: 5.88 Mil/uL — ABNORMAL HIGH (ref 4.22–5.81)
RDW: 14 % (ref 11.5–15.5)
WBC: 4.9 10*3/uL (ref 4.0–10.5)

## 2018-11-01 LAB — T3, FREE: T3, Free: 3.3 pg/mL (ref 2.3–4.2)

## 2018-11-01 LAB — LIPID PANEL
Cholesterol: 156 mg/dL (ref 0–200)
HDL: 49.4 mg/dL (ref >39.00)
LDL Cholesterol: 96 mg/dL (ref 0–99)
NonHDL: 106.61
Total CHOL/HDL Ratio: 3
Triglycerides: 51 mg/dL (ref 0.0–149.0)
VLDL: 10.2 mg/dL (ref 0.0–40.0)

## 2018-11-01 LAB — BASIC METABOLIC PANEL
BUN: 20 mg/dL (ref 6–23)
CO2: 29 mEq/L (ref 19–32)
Calcium: 9.5 mg/dL (ref 8.4–10.5)
Chloride: 101 mEq/L (ref 96–112)
Creatinine, Ser: 0.98 mg/dL (ref 0.40–1.50)
GFR: 87.19 mL/min (ref >60.00)
Glucose, Bld: 93 mg/dL (ref 70–99)
Potassium: 4.2 mEq/L (ref 3.5–5.1)
Sodium: 137 mEq/L (ref 135–145)

## 2018-11-01 LAB — TSH: TSH: 0.07 u[IU]/mL — ABNORMAL LOW (ref 0.35–4.50)

## 2018-11-01 LAB — IRON,TIBC AND FERRITIN PANEL
%SAT: 20 % (calc) (ref 20–48)
Ferritin: 171 ng/mL (ref 38–380)
Iron: 70 ug/dL (ref 50–180)
TIBC: 358 mcg/dL (calc) (ref 250–425)

## 2018-11-01 LAB — H. PYLORI ANTIBODY, IGG: H PYLORI IGG: NEGATIVE

## 2018-11-01 LAB — HEMOGLOBIN A1C: Hgb A1c MFr Bld: 5.7 % (ref 4.6–6.5)

## 2018-11-01 LAB — T4, FREE: Free T4: 1.3 ng/dL (ref 0.60–1.60)

## 2018-11-01 LAB — PSA: PSA: 2.27 ng/mL (ref 0.10–4.00)

## 2018-11-01 LAB — VITAMIN D 25 HYDROXY (VIT D DEFICIENCY, FRACTURES): VITD: 24.92 ng/mL — ABNORMAL LOW (ref 30.00–100.00)

## 2018-11-01 LAB — TESTOSTERONE: Testosterone: 444.89 ng/dL (ref 300.00–890.00)

## 2018-11-01 LAB — VITAMIN B12: Vitamin B-12: 526 pg/mL (ref 211–911)

## 2018-11-01 MED ORDER — LEVOTHYROXINE SODIUM 112 MCG PO TABS: 112.0000 ug | ORAL_TABLET | Freq: Every day | ORAL | 3 refills | Status: DC

## 2018-11-01 MED ORDER — VITAMIN D3 1.25 MG (50000 UT) PO CAPS: 1.0000 | ORAL_CAPSULE | ORAL | 0 refills | Status: DC

## 2018-11-01 NOTE — Progress Notes (Addendum)
Subjective:    Patient ID: Allen Ellis, male    DOB: June 25, 1971, 48 y.o.   MRN: [id number]  HPI Pt is ref by Dr Alain Marion, for thyroid cancer.  In 2015, pt had thyroidectomy in [place], and and uncertain type of thyroid cancer was found.  He says he had RAI x 1, soon after surgery.  He takes synthroid since then.  He has cold intolerance throughout the body, and assoc difficulty with concentration.    Addendum 11/17/18, with outside records received: 5/12; total thyroidectomy with RLND: multimicrofocal PTC, with no vasc ext, but right extrathyr ext.  11/13 R cent nodes pos (largest 14 mm), and 3/27 R lat nodes pos (largest 10 mm with extranodal extension) 7/12: RAI, 147 mCi 7/12: post-therapy scan: uptake at thyroid bed only 5/18: US neck: tiny nodules (<4 mm) at the right and left thyroid areas. Past Medical History:  Diagnosis Date  . Hemorrhoids   . Hepatitis B   . Prediabetes   . Thyroid cancer [facility])     Past Surgical History:  Procedure Laterality Date  . HEMORRHOID SURGERY    . TOTAL THYROIDECTOMY      Social History   Socioeconomic History  . Marital status: Single    Spouse name: Not on file  . Number of children: 3  . Years of education: Not on file  . Highest education level: Not on file  Occupational History  . Occupation: Airline pilot  Social Needs  . Financial resource strain: Not on file  . Food insecurity:    Worry: Not on file    Inability: Not on file  . Transportation needs:    Medical: Not on file    Non-medical: Not on file  Tobacco Use  . Smoking status: Former Smoker    Types: Cigarettes    Last attempt to quit: 09/25/2005    Years since quitting: 13.1  . Smokeless tobacco: Never Used  Substance and Sexual Activity  . Alcohol use: Yes    Comment: occasional  . Drug use: Never  . Sexual activity: Not on file  Lifestyle  . Physical activity:    Days per week: Not on file    Minutes per session: Not on file  . Stress: Not on file    Relationships  . Social connections:    Talks on phone: Not on file    Gets together: Not on file    Attends religious service: Not on file    Active member of club or organization: Not on file    Attends meetings of clubs or organizations: Not on file    Relationship status: Not on file  . Intimate partner violence:    Fear of current or ex partner: Not on file    Emotionally abused: Not on file    Physically abused: Not on file    Forced sexual activity: Not on file  Other Topics Concern  . Not on file  Social History Narrative  . Not on file    Current Outpatient Medications on File Prior to Visit  Medication Sig Dispense Refill  . b complex vitamins tablet Take 1 tablet by mouth daily. 100 tablet 3  . Cholecalciferol (VITAMIN D3) 50 MCG (2000 UT) capsule Take 1 capsule (2,000 Units total) by mouth daily. 100 capsule 3  . ciclopirox (PENLAC) 8 % solution Apply topically at bedtime. Apply over nail and surrounding skin. Apply daily over previous coat. After seven (7) days, may remove with alcohol and continue cycle.  6.6 mL 0  . clobetasol cream (TEMOVATE) 4.40 % Apply 1 application topically 2 (two) times daily. 60 g 3  . loratadine (CLARITIN) 10 MG tablet Take 1 tablet (10 mg total) by mouth daily. 30 tablet 11   No current facility-administered medications on file prior to visit.     No Known Allergies  Family History  Problem Relation Age of Onset  . Heart disease Mother   . Stomach cancer Mother   . Early death Mother   . Hyperlipidemia Mother   . Cancer Mother        stomach  . Diabetes Mother   . Heart attack Father   . Kidney cancer Father        mets   . Diabetes Father   . Cancer Father        kidney cancer  . Heart disease Father 31  . Breast cancer Paternal Grandfather        mets  . Diabetes Sister     BP 124/82 (BP Location: Right Arm, Patient Position: Sitting, Cuff Size: Normal)   Pulse 75   Ht 5' 8"  (1.727 m)   Wt 170 lb 9.6 oz (77.4 kg)    SpO2 98%   BMI 25.94 kg/m    Review of Systems denies depression, muscle cramps, sob, weight gain, constipation, numbness, cold intolerance, myalgias, rhinorrhea, easy bruising, and syncope.  He has hair loss, dry skin, and blurry vision.       Objective:   Physical Exam VS: see vs page GEN: no distress HEAD: head: no deformity eyes: no periorbital swelling, no proptosis external nose and ears are normal mouth: no lesion seen NECK: a healed scar is present (extending to the right lateral neck)  I do not appreciate a nodule in the thyroid or elsewhere in the neck CHEST WALL: no deformity LUNGS: clear to auscultation CV: reg rate and rhythm, no murmur ABD: abdomen is soft, nontender.  no hepatosplenomegaly.  not distended.  no hernia MUSCULOSKELETAL: muscle bulk and strength are grossly normal.  no obvious joint swelling.  gait is normal and steady EXTEMITIES: no deformity.  no edema PULSES: no carotid bruit NEURO:  cn 2-12 grossly intact.   readily moves all 4's.  sensation is intact to touch on all 4's SKIN:  Normal texture and temperature.  No rash or suspicious lesion is visible.   NODES:  None palpable at the neck PSYCH: alert, well-oriented.  Does not appear anxious nor depressed.  Lab Results  Component Value Date   TSH 0.07 (L) 11/01/2018    Lab Results  Component Value Date   HGBA1C 5.7 11/01/2018       Assessment & Plan:  Differentiated thyroid cancer, uncertain cell type.  Long scar suggests there was nodal involvement. Postsurgical hypothyroidism: slightly overreplaced.  Goal TSH for now will be borderline low. Hyperglycemia, new to me.  We discussed  Patient Instructions  Please sign a release of information from [place]. Please take levothyroxine, 112 mcg/day, and please recheck the blood tests in 1 month. Take vitamin-D, 1000 units per day.  Your chances of getting diabetes are 10% per year.  Diet and exercise cuts this to 5%.   Blood tests are requested for  you today.  We'll let you know about the results.   Please come back for a follow-up appointment in 6 months.

## 2018-11-01 NOTE — Patient Instructions (Addendum)
Please sign a release of information from [place]. Please take levothyroxine, 112 mcg/day, and please recheck the blood tests in 1 month. Take vitamin-D, 1000 units per day.  Your chances of getting diabetes are 10% per year.  Diet and exercise cuts this to 5%.   Blood tests are requested for you today.  We'll let you know about the results.   Please come back for a follow-up appointment in 6 months.

## 2018-11-02 LAB — THYROGLOBULIN ANTIBODY: Thyroglobulin Ab: <1 IU/mL (ref <=1)

## 2018-11-02 LAB — THYROGLOBULIN LEVEL: Thyroglobulin: 0.2 ng/mL — ABNORMAL LOW

## 2018-11-24 ENCOUNTER — Telehealth: Payer: Self-pay

## 2018-11-24 NOTE — Telephone Encounter (Signed)
PA approved.

## 2019-04-11 ENCOUNTER — Telehealth: Payer: Self-pay

## 2019-04-11 NOTE — Telephone Encounter (Signed)
I spoke with the pt and he states he does not wish to have any f/u at this time.  No labs, ROV or [place].  He states he will call when he is ready.

## 2019-04-11 NOTE — Telephone Encounter (Signed)
Left message on machine to call back  

## 2019-04-11 NOTE — Telephone Encounter (Signed)
-----   Message from Timothy Lasso, RN sent at 10/10/2018  9:00 AM EST ----- Regarding: Repeat testing Next hepatoma screening with ultrasound and alpha-fetoprotein should be 6 months from now and then he will need an office visit with me shortly afterwards.  Please also get CBC, coags and complete metabolic profile at the same time as the alpha-fetoprotein

## 2019-04-11 NOTE — Telephone Encounter (Signed)
Patient is returning your call.  

## 2019-04-12 ENCOUNTER — Other Ambulatory Visit: Payer: Self-pay

## 2019-04-12 DIAGNOSIS — K7460 Unspecified cirrhosis of liver: Secondary | ICD-10-CM

## 2019-04-12 NOTE — Telephone Encounter (Signed)
You have been scheduled for an abdominal ultrasound at [facility] (1st floor of hospital) on 04/17/19 at 9 am . Please arrive 15 minutes prior to your appointment for registration. Make certain not to have anything to eat or drink 6 hours prior to your appointment. Should you need to reschedule your appointment, please contact radiology at [phone]. This test typically takes about 30 minutes to perform.  Pt will get labs and has ROV scheduled for 7/28

## 2019-04-17 ENCOUNTER — Telehealth: Payer: Self-pay

## 2019-04-17 ENCOUNTER — Ambulatory Visit (HOSPITAL_COMMUNITY): Payer: BC Managed Care – PPO

## 2019-04-17 NOTE — Telephone Encounter (Signed)
OK to be seen here for DM, but why not keep the appt, and we'll check a1c when he is here?

## 2019-04-17 NOTE — Telephone Encounter (Signed)
Please review Dr. Cordelia Pen question and provide further clarification as to why pt canceled appt.

## 2019-04-17 NOTE — Telephone Encounter (Signed)
Patient called today to requesting to be seen here for his DM as well as thyroid-if MD approves this he is requesting labs be placed for this and the nurse call him once labs are placed to reschedule the lab appointment he canceled for his 04/27/19 appointment

## 2019-04-17 NOTE — Telephone Encounter (Signed)
Please review pt request and advise how you want me to proceed

## 2019-04-18 ENCOUNTER — Other Ambulatory Visit: Payer: BLUE CROSS/BLUE SHIELD

## 2019-04-24 NOTE — Telephone Encounter (Signed)
Patient is scheduled 04/27/19

## 2019-04-25 ENCOUNTER — Telehealth: Payer: Self-pay | Admitting: Gastroenterology

## 2019-04-25 ENCOUNTER — Telehealth: Payer: Self-pay | Admitting: Endocrinology

## 2019-04-25 ENCOUNTER — Other Ambulatory Visit: Payer: Self-pay

## 2019-04-25 DIAGNOSIS — K7460 Unspecified cirrhosis of liver: Secondary | ICD-10-CM

## 2019-04-25 NOTE — Telephone Encounter (Signed)
Patient is needing to cancel hospital [place] that is schedule for 05/01/2019. He would also like to know if he can get the order for the [place] sent to [facility]? He has reached out to them and it is way more cheaper than the hospital. Please call and advise thanks.

## 2019-04-25 NOTE — Telephone Encounter (Signed)
Before calling pt, reviewed his cancellation history (should he question your response).   Pt actually cancelled his lab appt that was scheduled 7/1, THEN cancelled with you for 7/10. LOV and only OV with you was 11/01/18. Before establishing with you, he canceled 12/20 NP appt with you and 12/16 NP appt with Dr. Kelton Pillar.   Do you want to reconsider based on above info?

## 2019-04-25 NOTE — Telephone Encounter (Signed)
No.  Too many cancellations in the past.

## 2019-04-25 NOTE — Telephone Encounter (Signed)
I assume this refers to DM.  Options: ov here, or ask PCP to do labs.

## 2019-04-25 NOTE — Telephone Encounter (Signed)
i'll order to be done in advance of appt, but he needs to schedule an appt first.

## 2019-04-25 NOTE — Telephone Encounter (Signed)
Patient is cancelling his 04/27/19 because he states that he has not had labs and see no reason to see the doctor if there are no labs results to review and work from.  States if someone will call him and advise about labs he will able to make a decision about appoitment  Call back numbe is [phone]

## 2019-04-25 NOTE — Telephone Encounter (Signed)
Differentiated thyroid cancer, uncertain cell type.  Long scar suggests there was nodal involvement. Postsurgical hypothyroidism: slightly overreplaced.  Goal TSH for now will be borderline low. Hyperglycemia, new to me.  We discussed  Patient Instructions  Please sign a release of information from [place]. Please take levothyroxine, 112 mcg/day, and please recheck the blood tests in 1 month. Take vitamin-D, 1000 units per day.  Your chances of getting diabetes are 10% per year.  Diet and exercise cuts this to 5%.   Blood tests are requested for you today.  We'll let you know about the results.   Please come back for a follow-up appointment in 6 months.

## 2019-04-25 NOTE — Telephone Encounter (Signed)
Would you be willing to write orders for labs to be drawn FIRST, then pt can reschedule appt?

## 2019-04-26 NOTE — Telephone Encounter (Signed)
[place] at [facility] cancelled  And [place] scheduled at [facility], per patient request. Scheduled 05/04/19 @ 7:40am. Patient knows to be NPO after midnight on 05/03/19 and to wear a mask.

## 2019-04-26 NOTE — Telephone Encounter (Signed)
As you requested, I am forwarding this message

## 2019-04-26 NOTE — Telephone Encounter (Signed)
OK, that is fine.  When pt schedules appt, i'll request labs for him to do in advance of appt.

## 2019-04-26 NOTE — Telephone Encounter (Signed)
Called and spoke to the patient and he now states that he has never had any issue with coming to the office for an appointment or for blood work. He states that when he lived in [place], his doctor told him that he was prediabetic and he would like Dr. Loanne Drilling to check for that and address it if need be. Patient stated that there is no reason for him to see one endocrinologist for his thyroid and see another for diabetic issues, if in fact, he does have diabetic issues. Pt would like to know if you would do blood work to determine if his previous doctors diagnosis of prediabetes needs to be addressed and/or treated.

## 2019-04-27 ENCOUNTER — Ambulatory Visit: Payer: BLUE CROSS/BLUE SHIELD | Admitting: Endocrinology

## 2019-04-27 NOTE — Telephone Encounter (Signed)
At Dr. Cordelia Pen request, called pt to schedule appts for BOTH labs and follow up. It appears Dr. Loanne Drilling cannot put in orders without an appt first being scheduled with him. Once scheduled, will forward this message back to Dr. Loanne Drilling. LVM requesting returned call.

## 2019-05-01 ENCOUNTER — Ambulatory Visit (HOSPITAL_COMMUNITY): Payer: BC Managed Care – PPO

## 2019-05-04 ENCOUNTER — Other Ambulatory Visit: Payer: BC Managed Care – PPO

## 2019-05-04 ENCOUNTER — Ambulatory Visit
Admission: RE | Admit: 2019-05-04 | Discharge: 2019-05-04 | Disposition: A | Discharge status: Home / Self Care | Payer: BC Managed Care – PPO | Source: Ambulatory Visit | Attending: Gastroenterology | Admitting: Gastroenterology

## 2019-05-04 ENCOUNTER — Other Ambulatory Visit: Payer: Self-pay | Admitting: Endocrinology

## 2019-05-04 DIAGNOSIS — E890 Postprocedural hypothyroidism: Secondary | ICD-10-CM

## 2019-05-04 DIAGNOSIS — C73 Malignant neoplasm of thyroid gland: Secondary | ICD-10-CM

## 2019-05-04 DIAGNOSIS — K7460 Unspecified cirrhosis of liver: Secondary | ICD-10-CM

## 2019-05-04 DIAGNOSIS — R739 Hyperglycemia, unspecified: Secondary | ICD-10-CM

## 2019-05-04 NOTE — Telephone Encounter (Signed)
Patient is scheduled for labs today and appointment next Friday. Patient requested that this lab work include for his diabetes.  Please Advise, Thanks

## 2019-05-04 NOTE — Telephone Encounter (Signed)
Please advise 

## 2019-05-04 NOTE — Telephone Encounter (Signed)
I ordered.

## 2019-05-07 ENCOUNTER — Other Ambulatory Visit: Payer: Self-pay

## 2019-05-07 ENCOUNTER — Other Ambulatory Visit (INDEPENDENT_AMBULATORY_CARE_PROVIDER_SITE_OTHER): Payer: BC Managed Care – PPO

## 2019-05-07 DIAGNOSIS — K7460 Unspecified cirrhosis of liver: Secondary | ICD-10-CM

## 2019-05-07 DIAGNOSIS — C73 Malignant neoplasm of thyroid gland: Secondary | ICD-10-CM

## 2019-05-07 DIAGNOSIS — E890 Postprocedural hypothyroidism: Secondary | ICD-10-CM

## 2019-05-07 DIAGNOSIS — R109 Unspecified abdominal pain: Secondary | ICD-10-CM

## 2019-05-07 DIAGNOSIS — R739 Hyperglycemia, unspecified: Secondary | ICD-10-CM

## 2019-05-07 DIAGNOSIS — R1310 Dysphagia, unspecified: Secondary | ICD-10-CM

## 2019-05-07 LAB — CBC WITH DIFFERENTIAL/PLATELET
Basophils Absolute: 0 10*3/uL (ref 0.0–0.1)
Basophils Relative: 0.8 % (ref 0.0–3.0)
Eosinophils Absolute: 0.1 10*3/uL (ref 0.0–0.7)
Eosinophils Relative: 1.4 % (ref 0.0–5.0)
HCT: 40.8 % (ref 39.0–52.0)
Hemoglobin: 13.3 g/dL (ref 13.0–17.0)
Lymphocytes Relative: 37.4 % (ref 12.0–46.0)
Lymphs Abs: 1.7 10*3/uL (ref 0.7–4.0)
MCHC: 32.6 g/dL (ref 30.0–36.0)
MCV: 72.8 fl — ABNORMAL LOW (ref 78.0–100.0)
Monocytes Absolute: 0.5 10*3/uL (ref 0.1–1.0)
Monocytes Relative: 11.4 % (ref 3.0–12.0)
Neutro Abs: 2.2 10*3/uL (ref 1.4–7.7)
Neutrophils Relative %: 49 % (ref 43.0–77.0)
Platelets: 271 10*3/uL (ref 150.0–400.0)
RBC: 5.61 Mil/uL (ref 4.22–5.81)
RDW: 13.9 % (ref 11.5–15.5)
WBC: 4.6 10*3/uL (ref 4.0–10.5)

## 2019-05-07 LAB — COMPREHENSIVE METABOLIC PANEL
ALT: 34 U/L (ref 0–53)
AST: 19 U/L (ref 0–37)
Albumin: 4.3 g/dL (ref 3.5–5.2)
Alkaline Phosphatase: 59 U/L (ref 39–117)
BUN: 12 mg/dL (ref 6–23)
CO2: 26 mEq/L (ref 19–32)
Calcium: 9.1 mg/dL (ref 8.4–10.5)
Chloride: 104 mEq/L (ref 96–112)
Creatinine, Ser: 0.78 mg/dL (ref 0.40–1.50)
GFR: 106.52 mL/min (ref >60.00)
Glucose, Bld: 90 mg/dL (ref 70–99)
Potassium: 3.9 mEq/L (ref 3.5–5.1)
Sodium: 138 mEq/L (ref 135–145)
Total Bilirubin: 0.6 mg/dL (ref 0.2–1.2)
Total Protein: 7.1 g/dL (ref 6.0–8.3)

## 2019-05-07 LAB — PROTIME-INR
INR: 1.3 ratio — ABNORMAL HIGH (ref 0.8–1.0)
Prothrombin Time: 14.7 s — ABNORMAL HIGH (ref 9.6–13.1)

## 2019-05-07 LAB — T4, FREE: Free T4: 1.28 ng/dL (ref 0.60–1.60)

## 2019-05-07 LAB — HEMOGLOBIN A1C: Hgb A1c MFr Bld: 5.7 % (ref 4.6–6.5)

## 2019-05-07 LAB — TSH: TSH: 0.11 u[IU]/mL — ABNORMAL LOW (ref 0.35–4.50)

## 2019-05-08 ENCOUNTER — Other Ambulatory Visit: Payer: BC Managed Care – PPO

## 2019-05-08 LAB — THYROGLOBULIN LEVEL: Thyroglobulin: 0.2 ng/mL — ABNORMAL LOW

## 2019-05-08 LAB — THYROGLOBULIN ANTIBODY: Thyroglobulin Ab: <1 IU/mL (ref <=1)

## 2019-05-08 LAB — AFP TUMOR MARKER: AFP-Tumor Marker: 1.5 ng/mL (ref <6.1)

## 2019-05-09 ENCOUNTER — Other Ambulatory Visit: Payer: Self-pay

## 2019-05-11 ENCOUNTER — Other Ambulatory Visit: Payer: Self-pay

## 2019-05-11 ENCOUNTER — Encounter: Payer: Self-pay | Admitting: Endocrinology

## 2019-05-11 ENCOUNTER — Ambulatory Visit: Payer: BC Managed Care – PPO | Admitting: Endocrinology

## 2019-05-11 VITALS — BP 130/82 | HR 71 | Ht 68.0 in | Wt 167.2 lb

## 2019-05-11 DIAGNOSIS — E890 Postprocedural hypothyroidism: Secondary | ICD-10-CM

## 2019-05-11 DIAGNOSIS — R739 Hyperglycemia, unspecified: Secondary | ICD-10-CM | POA: Diagnosis not present

## 2019-05-11 DIAGNOSIS — C73 Malignant neoplasm of thyroid gland: Secondary | ICD-10-CM | POA: Diagnosis not present

## 2019-05-11 NOTE — Patient Instructions (Addendum)
Let's recheck the ultrasound.  you will receive a phone call, about a day and time for an appointment. Please continue the same medications Your chances of getting diabetes are 10% per year.  Diet and exercise cuts this to 5%.   Please come back for a follow-up appointment in 6-12 months.

## 2019-05-11 NOTE — Progress Notes (Signed)
Subjective:    Patient ID: Allen Ellis, male    DOB: 04-12-1971, 48 y.o.   MRN: [id number]  HPI Pt returns for f/u of PTC, with this chronology:  5/12; total thyroidectomy with RLND: multimicrofocal PTC, with no vasc ext, but right extrathyr ext.  11/13 R cent nodes pos (largest 14 mm), and 3/27 R lat nodes pos (largest 10 mm with extranodal extension) 7/12: RAI, 147 mCi 7/12: post-therapy scan: uptake at thyroid bed only 5/18: US neck: tiny nodules (<4 mm) at the right and left thyroid areas. 1/20: TG=0.2 (ab neg) Pt reports slight swelling at the right ant neck, but no assoc pain. Pt also has hyperglycemia (dx'ed 2019).   Past Medical History:  Diagnosis Date  . Hemorrhoids   . Hepatitis B   . Prediabetes   . Thyroid cancer [facility])     Past Surgical History:  Procedure Laterality Date  . HEMORRHOID SURGERY    . TOTAL THYROIDECTOMY      Social History   Socioeconomic History  . Marital status: Single    Spouse name: Not on file  . Number of children: 3  . Years of education: Not on file  . Highest education level: Not on file  Occupational History  . Occupation: Airline pilot  Social Needs  . Financial resource strain: Not on file  . Food insecurity    Worry: Not on file    Inability: Not on file  . Transportation needs    Medical: Not on file    Non-medical: Not on file  Tobacco Use  . Smoking status: Former Smoker    Types: Cigarettes    Quit date: 09/25/2005    Years since quitting: 13.6  . Smokeless tobacco: Never Used  Substance and Sexual Activity  . Alcohol use: Yes    Comment: occasional  . Drug use: Never  . Sexual activity: Not on file  Lifestyle  . Physical activity    Days per week: Not on file    Minutes per session: Not on file  . Stress: Not on file  Relationships  . Social Herbalist on phone: Not on file    Gets together: Not on file    Attends religious service: Not on file    Active member of club or organization: Not on  file    Attends meetings of clubs or organizations: Not on file    Relationship status: Not on file  . Intimate partner violence    Fear of current or ex partner: Not on file    Emotionally abused: Not on file    Physically abused: Not on file    Forced sexual activity: Not on file  Other Topics Concern  . Not on file  Social History Narrative  . Not on file    Current Outpatient Medications on File Prior to Visit  Medication Sig Dispense Refill  . b complex vitamins tablet Take 1 tablet by mouth daily. 100 tablet 3  . Cholecalciferol (VITAMIN D3) 1.25 MG (50000 UT) CAPS Take 1 capsule by mouth once a week. 6 capsule 0  . Cholecalciferol (VITAMIN D3) 50 MCG (2000 UT) capsule Take 1 capsule (2,000 Units total) by mouth daily. 100 capsule 3  . ciclopirox (PENLAC) 8 % solution Apply topically at bedtime. Apply over nail and surrounding skin. Apply daily over previous coat. After seven (7) days, may remove with alcohol and continue cycle. 6.6 mL 0  . clobetasol cream (TEMOVATE) 0.05 % Apply 1  application topically 2 (two) times daily. 60 g 3  . levothyroxine (SYNTHROID) 112 MCG tablet Take 1 tablet (112 mcg total) by mouth daily before breakfast. 90 tablet 3  . loratadine (CLARITIN) 10 MG tablet Take 1 tablet (10 mg total) by mouth daily. 30 tablet 11   No current facility-administered medications on file prior to visit.     No Known Allergies  Family History  Problem Relation Age of Onset  . Heart disease Mother   . Stomach cancer Mother   . Early death Mother   . Hyperlipidemia Mother   . Cancer Mother        stomach  . Diabetes Mother   . Heart attack Father   . Kidney cancer Father        mets   . Diabetes Father   . Cancer Father        kidney cancer  . Heart disease Father 34  . Breast cancer Paternal Grandfather        mets  . Diabetes Sister     BP 130/82 (BP Location: Left Arm, Patient Position: Sitting, Cuff Size: Normal)   Pulse 71   Ht 5' 8"  (1.727 m)   Wt  167 lb 3.2 oz (75.8 kg)   SpO2 98%   BMI 25.42 kg/m   Review of Systems Denies weight change, palpitations, and tremor    Objective:   Physical Exam VITAL SIGNS:  See vs page GENERAL: no distress Neck: a healed scar is present.  I do not appreciate a nodule in the thyroid or elsewhere in the neck   Lab Results  Component Value Date   CALCIUM 9.1 05/07/2019   Lab Results  Component Value Date   TSH 0.11 (L) 05/07/2019   TG=0.2  Lab Results  Component Value Date   HGBA1C 5.7 05/07/2019       Assessment & Plan:  Hyperglycemia: stable thyroid cancer: stable Hypothyroidism: well-replaced. This TSH is appropriate.  Patient Instructions  Let's recheck the ultrasound.  you will receive a phone call, about a day and time for an appointment. Please continue the same medications Your chances of getting diabetes are 10% per year.  Diet and exercise cuts this to 5%.   Please come back for a follow-up appointment in 6-12 months.

## 2019-05-14 ENCOUNTER — Telehealth: Payer: Self-pay

## 2019-05-14 NOTE — Telephone Encounter (Signed)
Covid-19 screening questions   Do you now or have you had a fever in the last 14 days no   Do you have any respiratory symptoms of shortness of breath or cough now or in the last 14 days no  Do you have any family members or close contacts with diagnosed or suspected Covid-19 in the past 14 days no  Have you been tested for Covid-19 and found to be positive no          

## 2019-05-15 ENCOUNTER — Ambulatory Visit: Payer: BC Managed Care – PPO | Admitting: Gastroenterology

## 2019-05-15 ENCOUNTER — Encounter: Payer: Self-pay | Admitting: Gastroenterology

## 2019-05-15 VITALS — BP 122/70 | HR 98 | Ht 69.0 in | Wt 167.0 lb

## 2019-05-15 DIAGNOSIS — B181 Chronic viral hepatitis B without delta-agent: Secondary | ICD-10-CM | POA: Diagnosis not present

## 2019-05-15 NOTE — Patient Instructions (Addendum)
RUQ [place] and labs (AFP, CMET, CBC, INR) in 6 months  Please return to see Dr. Ardis Hughs in 12 months  Thank you for entrusting me with your care and choosing [facility].  Dr Ardis Hughs

## 2019-05-15 NOTE — Progress Notes (Signed)
Review of pertinent gastrointestinal problems: 1.  Chronic hepatitis B without signs of cirrhosis. Cared for in [facility] for many years prior to establishing care in [place] in 2019.  [place] labs:2014 Hep B e antibody +. 2013 Hep C Ab negative, Hep B surface Ab negative, Hep B Surface Ag +, July 2017 normal complete metabolic profile including normal liver tests.  Hepatitis B quantitative level 196 IU/mL. Labs in [place]: December 2019; hepatitis B E antibody reactive, hepatitis B E antigen negative.  Hepatitis B surface antigen positive, hepatitis C antibody negative.  Hepatitis A total antibody positive.  Normal LFTs.  [place] 09/2018 normal liver, 04/2019 normal liver  AFP 09/2018 1.9, 04/2019 1.5    HPI: This is a very pleasant 48 year old man whom I last saw 7 months ago at the time of his initial visit.  Chief complaint is chronic hepatitis B  Says he feels well overall.  He does have minor intermittent GERD-like burning in his esophagus which is well treated with over-the-counter proton pump inhibitor for short course.  He has no alarm symptoms.  His weight has been stable.  No significant abdominal pains or issues with his bowels..  No overt bleeding  ROS: complete GI ROS as described in HPI, all other review negative.  Constitutional:  No unintentional weight loss   Past Medical History:  Diagnosis Date  . Hemorrhoids   . Hepatitis B   . Prediabetes   . Thyroid cancer [facility])     Past Surgical History:  Procedure Laterality Date  . HEMORRHOID SURGERY    . TOTAL THYROIDECTOMY      Current Outpatient Medications  Medication Sig Dispense Refill  . b complex vitamins tablet Take 1 tablet by mouth daily. 100 tablet 3  . Cholecalciferol (VITAMIN D3) 50 MCG (2000 UT) capsule Take 1 capsule (2,000 Units total) by mouth daily. 100 capsule 3  . levothyroxine (SYNTHROID) 112 MCG tablet Take 1 tablet (112 mcg total) by mouth daily before breakfast. 90 tablet 3   No current facility-administered  medications for this visit.     Allergies as of 05/15/2019  . (No Known Allergies)    Family History  Problem Relation Age of Onset  . Heart disease Mother   . Stomach cancer Mother   . Early death Mother   . Hyperlipidemia Mother   . Cancer Mother        stomach  . Diabetes Mother   . Heart attack Father   . Kidney cancer Father        mets   . Diabetes Father   . Cancer Father        kidney cancer  . Heart disease Father 84  . Breast cancer Paternal Grandfather        mets  . Diabetes Sister     Social History   Socioeconomic History  . Marital status: Single    Spouse name: Not on file  . Number of children: 3  . Years of education: Not on file  . Highest education level: Not on file  Occupational History  . Occupation: Airline pilot  Social Needs  . Financial resource strain: Not on file  . Food insecurity    Worry: Not on file    Inability: Not on file  . Transportation needs    Medical: Not on file    Non-medical: Not on file  Tobacco Use  . Smoking status: Former Smoker    Types: Cigarettes    Quit date: 09/25/2005  Years since quitting: 13.6  . Smokeless tobacco: Never Used  Substance and Sexual Activity  . Alcohol use: Yes    Comment: occasional  . Drug use: Never  . Sexual activity: Not on file  Lifestyle  . Physical activity    Days per week: Not on file    Minutes per session: Not on file  . Stress: Not on file  Relationships  . Social Herbalist on phone: Not on file    Gets together: Not on file    Attends religious service: Not on file    Active member of club or organization: Not on file    Attends meetings of clubs or organizations: Not on file    Relationship status: Not on file  . Intimate partner violence    Fear of current or ex partner: Not on file    Emotionally abused: Not on file    Physically abused: Not on file    Forced sexual activity: Not on file  Other Topics Concern  . Not on file  Social History  Narrative  . Not on file     Physical Exam: Pulse 98   Ht 5\' 9"  (1.753 m)   Wt 167 lb (75.8 kg)   BMI 24.66 kg/m  Constitutional: generally well-appearing Psychiatric: alert and oriented x3 Abdomen: soft, nontender, nondistended, no obvious ascites, no peritoneal signs, normal bowel sounds No peripheral edema noted in lower extremities  Assessment and plan: 48 y.o. male with chronic hepatitis B without cirrhosis  He has no signs of cirrhosis on imaging or clinically or biochemically.  Has very low level of virus based on labs checked many years ago in [place].  Hepatitis C antigen positive, hepatitis B surface antigen positive.  Both of those antibodies were negative.  Normal liver tests, transaminases.  He does not need treatment for the hepatitis B.  He does understand that he is at slightly increased risk for liver cancer given the chronic hepatitis B even despite the fact that he does not have cirrhosis and so because of that he will need ultrasound and alpha-fetoprotein every 6 months.  We will arrange for the screening test in 6 months from now as well as complete metabolic profile, CBC and coags.  He will return to see me in 12 months and sooner if needed.  Please see the "Patient Instructions" section for addition details about the plan.  Owens Loffler, MD [facility] 05/15/2019, 3:35 PM

## 2019-05-23 ENCOUNTER — Ambulatory Visit
Admission: RE | Admit: 2019-05-23 | Discharge: 2019-05-23 | Disposition: A | Discharge status: Home / Self Care | Payer: BC Managed Care – PPO | Source: Ambulatory Visit | Attending: Endocrinology | Admitting: Endocrinology

## 2019-05-23 ENCOUNTER — Other Ambulatory Visit: Payer: Self-pay

## 2019-05-23 DIAGNOSIS — C73 Malignant neoplasm of thyroid gland: Secondary | ICD-10-CM

## 2019-10-19 DIAGNOSIS — K219 Gastro-esophageal reflux disease without esophagitis: Secondary | ICD-10-CM

## 2019-10-19 HISTORY — DX: Gastro-esophageal reflux disease without esophagitis: K21.9

## 2019-10-23 ENCOUNTER — Telehealth: Payer: Self-pay | Admitting: Gastroenterology

## 2019-10-23 NOTE — Telephone Encounter (Signed)
Hey Dr. Ardis Hughs- this patient as requested to switch his GI provider from you to Dr. Carlean Purl. I asked him what his reasoning for wanting to switch and all he stated was I just don't feel comfortable and Dr. Carlean Purl has good reviews. Would you approve this switch?

## 2019-10-24 NOTE — Telephone Encounter (Signed)
I agree with those reviews, he is an excellent doctor.  OK to switch if it is OK with Dr. Carlean Purl.

## 2019-10-24 NOTE — Telephone Encounter (Signed)
Hey Dr. Carlean Purl- This patient has requested to switch to have you has his GI provider from Dr. Ardis Hughs. Dr. Ardis Hughs has approved the switch. Would you accept this patient?

## 2019-10-24 NOTE — Telephone Encounter (Signed)
Left message for patient to call back and schedule with Dr. Carlean Purl.

## 2019-10-24 NOTE — Telephone Encounter (Signed)
OK. I can see him.

## 2019-11-23 ENCOUNTER — Other Ambulatory Visit: Payer: Self-pay

## 2019-11-23 ENCOUNTER — Ambulatory Visit: Payer: BC Managed Care – PPO | Admitting: Internal Medicine

## 2019-11-23 ENCOUNTER — Encounter: Payer: Self-pay | Admitting: Internal Medicine

## 2019-11-23 ENCOUNTER — Other Ambulatory Visit (INDEPENDENT_AMBULATORY_CARE_PROVIDER_SITE_OTHER): Payer: BC Managed Care – PPO

## 2019-11-23 VITALS — BP 120/70 | HR 76 | Temp 97.9°F | Ht 68.75 in | Wt 169.2 lb

## 2019-11-23 DIAGNOSIS — E890 Postprocedural hypothyroidism: Secondary | ICD-10-CM | POA: Diagnosis not present

## 2019-11-23 DIAGNOSIS — K739 Chronic hepatitis, unspecified: Secondary | ICD-10-CM | POA: Diagnosis not present

## 2019-11-23 DIAGNOSIS — R1319 Other dysphagia: Secondary | ICD-10-CM

## 2019-11-23 DIAGNOSIS — R12 Heartburn: Secondary | ICD-10-CM | POA: Diagnosis not present

## 2019-11-23 DIAGNOSIS — R1310 Dysphagia, unspecified: Secondary | ICD-10-CM | POA: Diagnosis not present

## 2019-11-23 DIAGNOSIS — Z800 Family history of malignant neoplasm of digestive organs: Secondary | ICD-10-CM

## 2019-11-23 DIAGNOSIS — R634 Abnormal weight loss: Secondary | ICD-10-CM

## 2019-11-23 DIAGNOSIS — Z01818 Encounter for other preprocedural examination: Secondary | ICD-10-CM

## 2019-11-23 LAB — CBC WITH DIFFERENTIAL/PLATELET
Basophils Absolute: 0.1 10*3/uL (ref 0.0–0.1)
Basophils Relative: 1.1 % (ref 0.0–3.0)
Eosinophils Absolute: 0.1 10*3/uL (ref 0.0–0.7)
Eosinophils Relative: 1.3 % (ref 0.0–5.0)
HCT: 43 % (ref 39.0–52.0)
Hemoglobin: 13.9 g/dL (ref 13.0–17.0)
Lymphocytes Relative: 35.2 % (ref 12.0–46.0)
Lymphs Abs: 1.7 10*3/uL (ref 0.7–4.0)
MCHC: 32.3 g/dL (ref 30.0–36.0)
MCV: 73.4 fl — ABNORMAL LOW (ref 78.0–100.0)
Monocytes Absolute: 0.5 10*3/uL (ref 0.1–1.0)
Monocytes Relative: 10.7 % (ref 3.0–12.0)
Neutro Abs: 2.5 10*3/uL (ref 1.4–7.7)
Neutrophils Relative %: 51.7 % (ref 43.0–77.0)
Platelets: 281 10*3/uL (ref 150.0–400.0)
RBC: 5.85 Mil/uL — ABNORMAL HIGH (ref 4.22–5.81)
RDW: 13.5 % (ref 11.5–15.5)
WBC: 4.8 10*3/uL (ref 4.0–10.5)

## 2019-11-23 LAB — COMPREHENSIVE METABOLIC PANEL
ALT: 31 U/L (ref 0–53)
AST: 21 U/L (ref 0–37)
Albumin: 4.8 g/dL (ref 3.5–5.2)
Alkaline Phosphatase: 57 U/L (ref 39–117)
BUN: 18 mg/dL (ref 6–23)
CO2: 29 mEq/L (ref 19–32)
Calcium: 10 mg/dL (ref 8.4–10.5)
Chloride: 99 mEq/L (ref 96–112)
Creatinine, Ser: 0.91 mg/dL (ref 0.40–1.50)
GFR: 88.95 mL/min (ref >60.00)
Glucose, Bld: 81 mg/dL (ref 70–99)
Potassium: 4 mEq/L (ref 3.5–5.1)
Sodium: 137 mEq/L (ref 135–145)
Total Bilirubin: 0.6 mg/dL (ref 0.2–1.2)
Total Protein: 8.1 g/dL (ref 6.0–8.3)

## 2019-11-23 LAB — HEMOGLOBIN A1C: Hgb A1c MFr Bld: 5.7 % (ref 4.6–6.5)

## 2019-11-23 LAB — TSH: TSH: 0.03 u[IU]/mL — ABNORMAL LOW (ref 0.35–4.50)

## 2019-11-23 MED ORDER — ESOMEPRAZOLE MAGNESIUM 40 MG PO CPDR: 40.0000 mg | DELAYED_RELEASE_CAPSULE | Freq: Every day | ORAL | 0 refills | Status: DC

## 2019-11-23 NOTE — Patient Instructions (Signed)
You have been scheduled for an endoscopy. Please follow written instructions given to you at your visit today. If you use inhalers (even only as needed), please bring them with you on the day of your procedure.   Your provider has requested that you go to the basement level for lab work before leaving today. Press "B" on the elevator. The lab is located at the first door on the left as you exit the elevator.  Due to recent changes in healthcare laws, you may see the results of your imaging and laboratory studies on MyChart before your provider has had a chance to review them.  We understand that in some cases there may be results that are confusing or concerning to you. Not all laboratory results come back in the same time frame and the provider may be waiting for multiple results in order to interpret others.  Please give [place] 48 hours in order for your provider to thoroughly review all the results before contacting the office for clarification of your results.    We have given you information to read over about GERD.   I appreciate the opportunity to care for you. Silvano Rusk, MD, [facility]

## 2019-11-23 NOTE — Progress Notes (Addendum)
Allen Ellis 48 y.o. 1971/04/10 [id number]  Assessment & Plan:   Encounter Diagnoses  Name Primary?  . Esophageal dysphagia Yes  . Heartburn   . Chronic hepatitis ([place])   . Postoperative hypothyroidism   . Loss of weight-per patient   . Encounter for other preprocedural examination   . Family history of stomach cancer     Orders Placed This Encounter  Procedures  . SARS Coronavirus 2 ([facility] ONLY)  . CBC w/Diff  . AFP tumor marker  . Hepatitis B DNA, ultraquantitative, PCR  . Comp Met (CMET)  . TSH  . HgB A1c  . Ambulatory referral to Gastroenterology     Labs as above, plan to evaluate with an EGD possible dilation.  The dysphagia is a little bit vague he clearly has severe reflux.  Starting PPI.  Generic Nexium 40 mg daily.  He says he cannot take it in the morning because he supposed to wait 4 hours after taking his Synthroid.  That is longer than what I am aware of usually it is an hour but he can take this before lunch or supper for now.  He is concerned about trying to find somebody to drive him home though we have given him the numbers of services that he can use to help in that situation.  I do think with his overall situation plus the family history of stomach cancer that it is important to proceed with the EGD.  The risks and benefits as well as alternatives of endoscopic procedure(s) have been discussed and reviewed. All questions answered. The patient agrees to proceed.  Note that his weight loss is not substantiated based upon the data points we have though he could have gained weight and his reflux worsened and put him in this position and now he has lost weight again.  I appreciate the opportunity to care for this patient. CC: Plotnikov, Evie Lacks, MD  [place] or other imaging to screen for [facility] not needed if HBV DNA remains low  Subjective:   Chief Complaint: Dysphagia and weight loss and heartburn  HPI 49 year old [place] man with chronic hepatitis  B who is complaining of several months of severe heartburn and dysphagia and weight loss.  He has restricted his diet trying to avoid citrus foods tomato-based foods and reduced his food intake and is losing weight.  Since modifying the size of his food dysphagia seems to be less.  He is concerned because his mother had stomach cancer and he says it was missed on her original endoscopy and he wants to know how accurate an endoscopy would be.  Other concerns are that he lives with his 39-1/2-year-old son and does not really have a family network or somebody to drive him and wait for him for his procedure.   It does not sound like he has had dysphagia problems before though he has had some heartburn that was originally reported when he saw Dr. Ardis Hughs last year, as diet controlled.  He also has chronic hepatitis B and I think was previously thought to have cirrhosis but we see no evidence of that here.  Laboratory testing follow-up is overdue for that as well as ultrasound most likely as well though last ultrasound in 2020 was negative.  Wt Readings from Last 3 Encounters:  11/23/19 169 lb 3.2 oz (76.7 kg)  05/15/19 167 lb (75.8 kg)  05/11/19 167 lb 3.2 oz (75.8 kg)    No Known Allergies Current Meds  Medication Sig  .  b complex vitamins tablet Take 1 tablet by mouth daily.  . Cholecalciferol (VITAMIN D3) 50 MCG (2000 UT) capsule Take 1 capsule (2,000 Units total) by mouth daily.  Marland Kitchen levothyroxine (SYNTHROID) 112 MCG tablet Take 1 tablet (112 mcg total) by mouth daily before breakfast.   Past Medical History:  Diagnosis Date  . Chronic hepatitis B ([place])   . Hemorrhoids   . Prediabetes   . Thyroid cancer [facility])    Past Surgical History:  Procedure Laterality Date  . HEMORRHOID SURGERY    . TOTAL THYROIDECTOMY     Social History   Social History Narrative   Single, 3 children, teenage son lives with him (born about 2002-3)   From [place] in [place] since 1993   Corporate treasurer at [employer], former smoker, no drugs   family history includes Breast cancer in his paternal grandmother; Diabetes in his father and sister; Early death in his mother; Heart attack in his father; Heart disease in his mother; Heart disease (age of onset: 78) in his father; Hyperlipidemia in his mother; Kidney cancer in his father; Stomach cancer in his mother.   Review of Systems As above  Objective:   Physical Exam _0  120/70   Pulse 76   Temp 97.9 F (36.6 C)   Ht 5' 8.75" (1.746 m)   Wt 169 lb 3.2 oz (76.7 kg)   BMI 25.17 kg/m @  General:  NAD Eyes:   anicteric Lungs:  clear Heart::  S1S2 no rubs, murmurs or gallops Abdomen:  soft and nontender, BS+ Ext:   no edema, cyanosis or clubbing    Data Reviewed:  Primary care endocrine notes labs in the computer for the last year

## 2019-11-26 LAB — HEPATITIS B DNA, ULTRAQUANTITATIVE, PCR
Hepatitis B DNA (Calc): 1.59 Log IU/mL — ABNORMAL HIGH
Hepatitis B DNA: 39 IU/mL — ABNORMAL HIGH

## 2019-11-26 LAB — AFP TUMOR MARKER: AFP-Tumor Marker: 2 ng/mL (ref <6.1)

## 2019-12-03 ENCOUNTER — Other Ambulatory Visit: Payer: Self-pay | Admitting: Endocrinology

## 2019-12-04 ENCOUNTER — Ambulatory Visit (INDEPENDENT_AMBULATORY_CARE_PROVIDER_SITE_OTHER): Payer: BC Managed Care – PPO

## 2019-12-04 ENCOUNTER — Other Ambulatory Visit: Payer: Self-pay | Admitting: Internal Medicine

## 2019-12-04 DIAGNOSIS — Z1159 Encounter for screening for other viral diseases: Secondary | ICD-10-CM | POA: Diagnosis not present

## 2019-12-04 LAB — SARS CORONAVIRUS 2 (TAT 6-24 HRS): SARS Coronavirus 2: NEGATIVE

## 2019-12-06 ENCOUNTER — Encounter: Payer: BC Managed Care – PPO | Admitting: Internal Medicine

## 2019-12-13 ENCOUNTER — Other Ambulatory Visit: Payer: Self-pay | Admitting: Internal Medicine

## 2019-12-13 ENCOUNTER — Ambulatory Visit (INDEPENDENT_AMBULATORY_CARE_PROVIDER_SITE_OTHER): Payer: BC Managed Care – PPO

## 2019-12-13 DIAGNOSIS — Z1159 Encounter for screening for other viral diseases: Secondary | ICD-10-CM | POA: Diagnosis not present

## 2019-12-14 LAB — SARS CORONAVIRUS 2 (TAT 6-24 HRS): SARS Coronavirus 2: NEGATIVE

## 2019-12-17 HISTORY — PX: ESOPHAGOGASTRODUODENOSCOPY: SHX1529

## 2019-12-18 ENCOUNTER — Ambulatory Visit (AMBULATORY_SURGERY_CENTER): Payer: BC Managed Care – PPO | Admitting: Internal Medicine

## 2019-12-18 ENCOUNTER — Other Ambulatory Visit: Payer: Self-pay

## 2019-12-18 ENCOUNTER — Encounter: Payer: Self-pay | Admitting: Internal Medicine

## 2019-12-18 ENCOUNTER — Encounter: Payer: BC Managed Care – PPO | Admitting: Internal Medicine

## 2019-12-18 VITALS — BP 118/69 | HR 57 | Temp 95.9°F | Resp 12 | Ht 68.0 in | Wt 169.0 lb

## 2019-12-18 DIAGNOSIS — K222 Esophageal obstruction: Secondary | ICD-10-CM | POA: Diagnosis not present

## 2019-12-18 DIAGNOSIS — R1310 Dysphagia, unspecified: Secondary | ICD-10-CM | POA: Diagnosis not present

## 2019-12-18 DIAGNOSIS — R12 Heartburn: Secondary | ICD-10-CM

## 2019-12-18 DIAGNOSIS — K219 Gastro-esophageal reflux disease without esophagitis: Secondary | ICD-10-CM | POA: Diagnosis not present

## 2019-12-18 MED ORDER — SODIUM CHLORIDE 0.9 % IV SOLN: 500.0000 mL | Freq: Once | INTRAVENOUS | Status: DC

## 2019-12-18 NOTE — Progress Notes (Signed)
Pt's states no medical or surgical changes since previsit or office visit.   Sm Iv, DT vitals, LC temp.

## 2019-12-18 NOTE — Op Note (Addendum)
[facility] Patient Name: Allen Ellis Procedure Date: 12/18/2019 12:20 PM MRN: [id number] Endoscopist: Gatha Mayer , MD Age: 49 Referring MD:  Date of Birth: 03/05/1971 Gender: Male Account #: [id number] Procedure:                Upper GI endoscopy Indications:              Dysphagia, Heartburn Medicines:                Propofol per Anesthesia, Monitored Anesthesia Care Procedure:                Pre-Anesthesia Assessment:                           - Prior to the procedure, a History and Physical                            was performed, and patient medications and                            allergies were reviewed. The patient's tolerance of                            previous anesthesia was also reviewed. The risks                            and benefits of the procedure and the sedation                            options and risks were discussed with the patient.                            All questions were answered, and informed consent                            was obtained. Prior Anticoagulants: The patient has                            taken no previous anticoagulant or antiplatelet                            agents. ASA Grade Assessment: II - A patient with                            mild systemic disease. After reviewing the risks                            and benefits, the patient was deemed in                            satisfactory condition to undergo the procedure.                           After obtaining informed consent, the endoscope was  passed under direct vision. Throughout the                            procedure, the patient's blood pressure, pulse, and                            oxygen saturations were monitored continuously. The                            Endoscope was introduced through the mouth, and                            advanced to the second part of duodenum. The upper                            GI endoscopy  was accomplished without difficulty.                            The patient tolerated the procedure well. Scope In: Scope Out: Findings:                 One benign-appearing, intrinsic mild stenosis was                            found at the gastroesophageal junction. The                            stenosis was traversed. The scope was withdrawn.                            Dilation was performed with a Maloney dilator with                            mild resistance at 42 Fr. The dilation site was                            examined following endoscope reinsertion and showed                            mild mucosal disruption. Estimated blood loss was                            minimal.                           The exam was otherwise without abnormality.                           The cardia and gastric fundus were normal on                            retroflexion. Complications:            No immediate complications. Estimated Blood Loss:     Estimated blood loss was minimal. Impression:               -  Benign-appearing esophageal stenosis. Dilated. 52                            Fr Maloney                           - The examination was otherwise normal.                           - No specimens collected. Recommendation:           - Patient has a contact number available for                            emergencies. The signs and symptoms of potential                            delayed complications were discussed with the                            patient. Return to normal activities tomorrow.                            Written discharge instructions were provided to the                            patient.                           - Clear liquids x 1 hour then soft foods rest of                            day. Start prior diet tomorrow.                           - Continue present medications.                           - Should start daily PPI (he has Rx already) to                             reduce risk of stricture recurrence                           - OFFICE VISIT RECALL 6 MOS ([facility])                           - He is not inclined to take the PPI more than 3 mos Gatha Mayer, MD 12/18/2019 12:47:56 PM This report has been signed electronically.

## 2019-12-18 NOTE — Patient Instructions (Addendum)
There was a stricture or scarred area where esophagus and stomach meet - this is where food would stop.  I dilated it so you should swallow better.  We think this problem is from acid reflux.  I think you should take the esomeprazole every day - taking before supper makes sense.  It can prevent more scarring from occurring.  No ulcers, signs of cancer or pre-cancerous changes.  I appreciate the opportunity to care for you. Gatha Mayer, MD, [facility]  Please read handouts provided. Continue present medications. Follow Dilation Diet. Office visit recall 6 months.     YOU HAD AN ENDOSCOPIC PROCEDURE TODAY AT [facility]:   Refer to the procedure report that was given to you for any specific questions about what was found during the examination.  If the procedure report does not answer your questions, please call your gastroenterologist to clarify.  If you requested that your care partner not be given the details of your procedure findings, then the procedure report has been included in a sealed envelope for you to review at your convenience later.  YOU SHOULD EXPECT: Some feelings of bloating in the abdomen. Passage of more gas than usual.  Walking can help get rid of the air that was put into your GI tract during the procedure and reduce the bloating. If you had a lower endoscopy (such as a colonoscopy or flexible sigmoidoscopy) you may notice spotting of blood in your stool or on the toilet paper. If you underwent a bowel prep for your procedure, you may not have a normal bowel movement for a few days.  Please Note:  You might notice some irritation and congestion in your nose or some drainage.  This is from the oxygen used during your procedure.  There is no need for concern and it should clear up in a day or so.  SYMPTOMS TO REPORT IMMEDIATELY:    Following upper endoscopy (EGD)  Vomiting of blood or coffee ground material  New chest pain or pain under the shoulder  blades  Painful or persistently difficult swallowing  New shortness of breath  Fever of 100F or higher  Black, tarry-looking stools  For urgent or emergent issues, a gastroenterologist can be reached at any hour by calling [phone].   DIET:  Drink plenty of fluids but you should avoid alcoholic beverages for 24 hours.  ACTIVITY:  You should plan to take it easy for the rest of today and you should NOT DRIVE or use heavy machinery until tomorrow (because of the sedation medicines used during the test).    FOLLOW UP: Our staff will call the number listed on your records 48-72 hours following your procedure to check on you and address any questions or concerns that you may have regarding the information given to you following your procedure. If we do not reach you, we will leave a message.  We will attempt to reach you two times.  During this call, we will ask if you have developed any symptoms of COVID 19. If you develop any symptoms (ie: fever, flu-like symptoms, shortness of breath, cough etc.) before then, please call [phone].  If you test positive for Covid 19 in the 2 weeks post procedure, please call and report this information to [place].    If any biopsies were taken you will be contacted by phone or by letter within the next 1-3 weeks.  Please call us at [phone] if you have not heard about the  biopsies in 3 weeks.    SIGNATURES/CONFIDENTIALITY: You and/or your care partner have signed paperwork which will be entered into your electronic medical record.  These signatures attest to the fact that that the information above on your After Visit Summary has been reviewed and is understood.  Full responsibility of the confidentiality of this discharge information lies with you and/or your care-partner.

## 2019-12-18 NOTE — Progress Notes (Signed)
Called to room to assist during endoscopic procedure.  Patient ID and intended procedure confirmed with present staff. Received instructions for my participation in the procedure from the performing physician.  

## 2019-12-20 ENCOUNTER — Telehealth: Payer: Self-pay | Admitting: *Deleted

## 2019-12-20 NOTE — Telephone Encounter (Signed)
  Follow up Call-  Call back number 12/18/2019  Post procedure Call Back phone  # [phone]  Permission to leave phone message Yes  Some recent data might be hidden     Patient questions:  " This connection can not be accessed at this time."

## 2019-12-20 NOTE — Telephone Encounter (Signed)
  Follow up Call-  Call back number 12/18/2019  Post procedure Call Back phone  # [phone]  Permission to leave phone message Yes  Some recent data might be hidden    [phone]  [facility] to call back with any questions or concerns.  Also, call back if patient has developed fever, respiratory issues or been dx with COVID or had any family members or close contacts diagnosed since her procedure.

## 2020-02-17 ENCOUNTER — Other Ambulatory Visit: Payer: Self-pay | Admitting: Internal Medicine

## 2020-05-13 ENCOUNTER — Ambulatory Visit: Payer: BC Managed Care – PPO | Admitting: Endocrinology

## 2020-06-04 ENCOUNTER — Encounter: Payer: Self-pay | Admitting: Internal Medicine

## 2020-06-04 ENCOUNTER — Ambulatory Visit (INDEPENDENT_AMBULATORY_CARE_PROVIDER_SITE_OTHER): Payer: BC Managed Care – PPO | Admitting: Internal Medicine

## 2020-06-04 ENCOUNTER — Other Ambulatory Visit: Payer: Self-pay

## 2020-06-04 VITALS — BP 102/68 | HR 70 | Temp 98.6°F | Ht 68.75 in | Wt 142.0 lb

## 2020-06-04 DIAGNOSIS — B009 Herpesviral infection, unspecified: Secondary | ICD-10-CM | POA: Insufficient documentation

## 2020-06-04 DIAGNOSIS — C73 Malignant neoplasm of thyroid gland: Secondary | ICD-10-CM

## 2020-06-04 DIAGNOSIS — E890 Postprocedural hypothyroidism: Secondary | ICD-10-CM

## 2020-06-04 DIAGNOSIS — B181 Chronic viral hepatitis B without delta-agent: Secondary | ICD-10-CM

## 2020-06-04 DIAGNOSIS — R739 Hyperglycemia, unspecified: Secondary | ICD-10-CM

## 2020-06-04 DIAGNOSIS — L308 Other specified dermatitis: Secondary | ICD-10-CM

## 2020-06-04 DIAGNOSIS — Z0000 Encounter for general adult medical examination without abnormal findings: Secondary | ICD-10-CM

## 2020-06-04 DIAGNOSIS — Z8249 Family history of ischemic heart disease and other diseases of the circulatory system: Secondary | ICD-10-CM

## 2020-06-04 LAB — COMPLETE METABOLIC PANEL WITH GFR
AG Ratio: 1.8 (calc) (ref 1.0–2.5)
ALT: 24 U/L (ref 9–46)
AST: 17 U/L (ref 10–40)
Albumin: 4.6 g/dL (ref 3.6–5.1)
Alkaline phosphatase (APISO): 51 U/L (ref 36–130)
BUN: 14 mg/dL (ref 7–25)
CO2: 26 mmol/L (ref 20–32)
Calcium: 9.9 mg/dL (ref 8.6–10.3)
Chloride: 99 mmol/L (ref 98–110)
Creat: 0.9 mg/dL (ref 0.60–1.35)
GFR, Est African American: 117 mL/min/{1.73_m2} (ref >=60)
GFR, Est Non African American: 101 mL/min/{1.73_m2} (ref >=60)
Globulin: 2.6 g/dL (calc) (ref 1.9–3.7)
Glucose, Bld: 74 mg/dL (ref 65–99)
Potassium: 4.4 mmol/L (ref 3.5–5.3)
Sodium: 137 mmol/L (ref 135–146)
Total Bilirubin: 0.8 mg/dL (ref 0.2–1.2)
Total Protein: 7.2 g/dL (ref 6.1–8.1)

## 2020-06-04 MED ORDER — CLOBETASOL PROPIONATE 0.05 % EX CREA: 1.0000 "application " | TOPICAL_CREAM | Freq: Two times a day (BID) | CUTANEOUS | 3 refills | Status: DC

## 2020-06-04 MED ORDER — VALACYCLOVIR HCL 500 MG PO TABS: 500.0000 mg | ORAL_TABLET | Freq: Two times a day (BID) | ORAL | 3 refills | Status: DC

## 2020-06-04 NOTE — Assessment & Plan Note (Signed)
Labs

## 2020-06-04 NOTE — Assessment & Plan Note (Addendum)
F/u w/Dr Ardis Hughs LFTs, liver US, AFP q 6 mo

## 2020-06-04 NOTE — Patient Instructions (Addendum)
[employer] soap Nitril gloves   Cardiac CT calcium scoring test $150 Tel # is [phone]   Computed tomography, more commonly known as a CT or CAT scan, is a diagnostic medical imaging test. Like traditional x-rays, it produces multiple images or pictures of the inside of the body. The cross-sectional images generated during a CT scan can be reformatted in multiple planes. They can even generate three-dimensional images. These images can be viewed on a computer monitor, printed on film or by a 3D printer, or transferred to a CD or DVD. CT images of internal organs, bones, soft tissue and blood vessels provide greater detail than traditional x-rays, particularly of soft tissues and blood vessels. A cardiac CT scan for coronary calcium is a non-invasive way of obtaining information about the presence, location and extent of calcified plaque in the coronary arteries--the vessels that supply oxygen-containing blood to the heart muscle. Calcified plaque results when there is a build-up of fat and other substances under the inner layer of the artery. This material can calcify which signals the presence of atherosclerosis, a disease of the vessel wall, also called coronary artery disease (CAD). People with this disease have an increased risk for heart attacks. In addition, over time, progression of plaque build up (CAD) can narrow the arteries or even close off blood flow to the heart. The result may be chest pain, sometimes called "angina," or a heart attack. Because calcium is a marker of CAD, the amount of calcium detected on a cardiac CT scan is a helpful prognostic tool. The findings on cardiac CT are expressed as a calcium score. Another name for this test is coronary artery calcium scoring.  What are some common uses of the procedure? The goal of cardiac CT scan for calcium scoring is to determine if CAD is present and to what extent, even if there are no symptoms. It is a screening study that may be  recommended by a physician for patients with risk factors for CAD but no clinical symptoms. The major risk factors for CAD are: . high blood cholesterol levels  . family history of heart attacks  . diabetes  . high blood pressure  . cigarette smoking  . overweight or obese  . physical inactivity   A negative cardiac CT scan for calcium scoring shows no calcification within the coronary arteries. This suggests that CAD is absent or so minimal it cannot be seen by this technique. The chance of having a heart attack over the next two to five years is very low under these circumstances. A positive test means that CAD is present, regardless of whether or not the patient is experiencing any symptoms. The amount of calcification--expressed as the calcium score--may help to predict the likelihood of a myocardial infarction (heart attack) in the coming years and helps your medical doctor or cardiologist decide whether the patient may need to take preventive medicine or undertake other measures such as diet and exercise to lower the risk for heart attack. The extent of CAD is graded according to your calcium score:  Calcium Score  Presence of CAD (coronary artery disease)  0 No evidence of CAD   1-10 Minimal evidence of CAD  11-100 Mild evidence of CAD  101-400 Moderate evidence of CAD  Over 400 Extensive evidence of CAD

## 2020-06-04 NOTE — Progress Notes (Signed)
Subjective:  Patient ID: Allen Ellis, male    DOB: Apr 30, 1971  Age: 49 y.o. MRN: [id number]  CC: Annual Exam   HPI Allen Ellis presents for rash, GERD, Hep B,  thyroid cancer f/u Doing better C/o gen herpes Pt lost wt on diet  Outpatient Medications Prior to Visit  Medication Sig Dispense Refill  . b complex vitamins tablet Take 1 tablet by mouth daily. 100 tablet 3  . Cholecalciferol (VITAMIN D3) 50 MCG (2000 UT) capsule Take 1 capsule (2,000 Units total) by mouth daily. 100 capsule 3  . SYNTHROID 112 MCG tablet TAKE 1 TABLET BY MOUTH EVERY DAY BEFORE BREAKFAST 90 tablet 3  . esomeprazole (NEXIUM) 40 MG capsule TAKE 1 CAPSULE BY MOUTH DAILY 30 MINS BEFORE A MEAL (Patient not taking: Reported on 06/04/2020) 90 capsule 1   No facility-administered medications prior to visit.    ROS: Review of Systems  Constitutional: Negative for appetite change, fatigue and unexpected weight change.  HENT: Negative for congestion, nosebleeds, sneezing, sore throat and trouble swallowing.   Eyes: Negative for itching and visual disturbance.  Respiratory: Negative for cough.   Cardiovascular: Negative for chest pain, palpitations and leg swelling.  Gastrointestinal: Negative for abdominal distention, blood in stool, diarrhea and nausea.  Genitourinary: Negative for frequency and hematuria.  Musculoskeletal: Negative for back pain, gait problem, joint swelling and neck pain.  Skin: Positive for rash.  Neurological: Negative for dizziness, tremors, speech difficulty and weakness.  Psychiatric/Behavioral: Negative for agitation, dysphoric mood and sleep disturbance. The patient is not nervous/anxious.     Objective:  BP 102/68 (BP Location: Left Arm)   Pulse 70   Temp 98.6 F (37 C)   Ht 5' 8.75" (1.746 m)   Wt 142 lb (64.4 kg)   SpO2 97%   BMI 21.12 kg/m   BP Readings from Last 3 Encounters:  06/04/20 102/68  12/18/19 118/69  11/23/19 120/70    Wt Readings from Last 3 Encounters:    06/04/20 142 lb (64.4 kg)  12/18/19 169 lb (76.7 kg)  11/23/19 169 lb 3.2 oz (76.7 kg)    Physical Exam Constitutional:      General: He is not in acute distress.    Appearance: He is well-developed.     Comments: NAD  Eyes:     Conjunctiva/sclera: Conjunctivae normal.     Pupils: Pupils are equal, round, and reactive to light.  Neck:     Thyroid: No thyromegaly.     Vascular: No JVD.  Cardiovascular:     Rate and Rhythm: Normal rate and regular rhythm.     Heart sounds: Normal heart sounds. No murmur heard.  No friction rub. No gallop.   Pulmonary:     Effort: Pulmonary effort is normal. No respiratory distress.     Breath sounds: Normal breath sounds. No wheezing or rales.  Chest:     Chest wall: No tenderness.  Abdominal:     General: Bowel sounds are normal. There is no distension.     Palpations: Abdomen is soft. There is no mass.     Tenderness: There is no abdominal tenderness. There is no guarding or rebound.  Musculoskeletal:        General: No tenderness. Normal range of motion.     Cervical back: Normal range of motion.  Lymphadenopathy:     Cervical: No cervical adenopathy.  Skin:    General: Skin is warm and dry.     Findings: Rash present.  Neurological:  Mental Status: He is alert and oriented to person, place, and time.     Cranial Nerves: No cranial nerve deficit.     Motor: No abnormal muscle tone.     Coordination: Coordination normal.     Gait: Gait normal.     Deep Tendon Reflexes: Reflexes are normal and symmetric.  Psychiatric:        Behavior: Behavior normal.        Thought Content: Thought content normal.        Judgment: Judgment normal.   hand eczema  Lab Results  Component Value Date   WBC 4.8 11/23/2019   HGB 13.9 11/23/2019   HCT 43.0 11/23/2019   PLT 281.0 11/23/2019   GLUCOSE 81 11/23/2019   CHOL 156 11/01/2018   TRIG 51.0 11/01/2018   HDL 49.40 11/01/2018   LDLCALC 96 11/01/2018   ALT 31 11/23/2019   AST 21  11/23/2019   NA 137 11/23/2019   K 4.0 11/23/2019   CL 99 11/23/2019   CREATININE 0.91 11/23/2019   BUN 18 11/23/2019   CO2 29 11/23/2019   TSH 0.03 (L) 11/23/2019   PSA 2.27 11/01/2018   INR 1.3 (H) 05/07/2019   HGBA1C 5.7 11/23/2019    US THYROID  Result Date: 05/24/2019 CLINICAL DATA:  Thyroid carcinoma, post thyroidectomy. Palpable lesions. EXAM: THYROID ULTRASOUND TECHNIQUE: Ultrasound examination of the thyroidectomy bed and adjacent soft tissues was performed. COMPARISON:  02/21/2017 by report only FINDINGS: Parenchymal Echotexture: Surgically absent Isthmus: Surgically absent Right lobe: Surgically absent Left lobe: Surgically absent _________________________________________________________ Estimated total number of nodules >/= 1 cm: 0 Number of spongiform nodules >/=  2 cm not described below (TR1): 0 Number of mixed cystic and solid nodules >/= 1.5 cm not described below (TR2): 0 0.4 cm hypoechoic focus with central echogenicity left thyroidectomy bed, previously 0.4 cm by report 0.2 cm hypoechoic focus with central echogenicity right thyroidectomy bed, previously 0.4 cm by report _________________________________________________________ Morphologically unremarkable right submandibular lymph node 0.7 cm short axis diameter. Similar 0.5 cm left submandibular lymph node. IMPRESSION: 1. Small hypoechoic foci in the thyroidectomy bed as previously described. 2. No evidence of regional adenopathy. The above is in keeping with the ACR TI-RADS recommendations - J Am Coll Radiol 2017;14:587-595. Electronically Signed   By: Lucrezia Europe M.D.   On: 05/24/2019 08:39    Assessment & Plan:   There are no diagnoses linked to this encounter.   No orders of the defined types were placed in this encounter.    Follow-up: No follow-ups on file.  Walker Kehr, MD

## 2020-06-04 NOTE — Assessment & Plan Note (Signed)
CT ca scoring suggested

## 2020-06-04 NOTE — Assessment & Plan Note (Signed)
[facility]

## 2020-06-04 NOTE — Assessment & Plan Note (Signed)
  We discussed age appropriate health related issues, including available/recomended screening tests and vaccinations. Labs were ordered to be later reviewed . All questions were answered. We discussed one or more of the following - seat belt use, use of sunscreen/sun exposure exercise, safe sex, fall risk reduction, second hand smoke exposure, firearm use and storage, seat belt use, a need for adhering to healthy diet and exercise. Labs were ordered or discussed if they are available. All questions were answered.

## 2020-06-04 NOTE — Assessment & Plan Note (Signed)
Valtrex

## 2020-06-05 LAB — PSA: PSA: 2.1 ng/mL (ref <=4.0)

## 2020-06-05 LAB — CBC WITH DIFFERENTIAL/PLATELET
Absolute Monocytes: 578 cells/uL (ref 200–950)
Basophils Absolute: 50 cells/uL (ref 0–200)
Basophils Relative: 0.9 %
Eosinophils Absolute: 61 cells/uL (ref 15–500)
Eosinophils Relative: 1.1 %
HCT: 42.4 % (ref 38.5–50.0)
Hemoglobin: 13.1 g/dL — ABNORMAL LOW (ref 13.2–17.1)
Lymphs Abs: 1672 cells/uL (ref 850–3900)
MCH: 23.8 pg — ABNORMAL LOW (ref 27.0–33.0)
MCHC: 30.9 g/dL — ABNORMAL LOW (ref 32.0–36.0)
MCV: 77 fL — ABNORMAL LOW (ref 80.0–100.0)
MPV: 9.5 fL (ref 7.5–12.5)
Monocytes Relative: 10.5 %
Neutro Abs: 3141 cells/uL (ref 1500–7800)
Neutrophils Relative %: 57.1 %
Platelets: 306 10*3/uL (ref 140–400)
RBC: 5.51 10*6/uL (ref 4.20–5.80)
RDW: 13.5 % (ref 11.0–15.0)
Total Lymphocyte: 30.4 %
WBC: 5.5 10*3/uL (ref 3.8–10.8)

## 2020-06-05 LAB — URINALYSIS
Bilirubin Urine: NEGATIVE
Glucose, UA: NEGATIVE
Hgb urine dipstick: NEGATIVE
Leukocytes,Ua: NEGATIVE
Nitrite: NEGATIVE
Protein, ur: NEGATIVE
Specific Gravity, Urine: 1.006 (ref 1.001–1.03)
pH: 5.5 (ref 5.0–8.0)

## 2020-06-05 LAB — LIPID PANEL
Cholesterol: 229 mg/dL — ABNORMAL HIGH (ref <200)
HDL: 74 mg/dL (ref >=40)
LDL Cholesterol (Calc): 141 mg/dL (calc) — ABNORMAL HIGH
Non-HDL Cholesterol (Calc): 155 mg/dL (calc) — ABNORMAL HIGH (ref <130)
Total CHOL/HDL Ratio: 3.1 (calc) (ref <5.0)
Triglycerides: 46 mg/dL (ref <150)

## 2020-06-05 LAB — HEMOGLOBIN A1C
Hgb A1c MFr Bld: 5.2 % of total Hgb (ref <5.7)
Mean Plasma Glucose: 103 (calc)
eAG (mmol/L): 5.7 (calc)

## 2020-06-05 LAB — VITAMIN B12: Vitamin B-12: 777 pg/mL (ref 200–1100)

## 2020-06-05 LAB — VITAMIN D 25 HYDROXY (VIT D DEFICIENCY, FRACTURES): Vit D, 25-Hydroxy: 38 ng/mL (ref 30–100)

## 2020-06-05 LAB — TSH: TSH: 0.04 mIU/L — ABNORMAL LOW (ref 0.40–4.50)

## 2020-06-05 LAB — T4, FREE: Free T4: 1.6 ng/dL (ref 0.8–1.8)

## 2020-06-28 DIAGNOSIS — Z20822 Contact with and (suspected) exposure to covid-19: Secondary | ICD-10-CM | POA: Diagnosis not present

## 2020-06-30 ENCOUNTER — Telehealth: Payer: Self-pay | Admitting: Internal Medicine

## 2020-06-30 NOTE — Telephone Encounter (Signed)
    Patient states he has tested positive for COVID19 today. Patient requesting antibody testing

## 2020-06-30 NOTE — Telephone Encounter (Signed)
Patient's son tested positive for covid, patient tested and is awaiting results  currenlty having fever, back pain, green mucous would like antibiotics so that it doesn't go into his chest  [id number] [address], [place] Phone:  [phone]  Fax:  [phone]

## 2020-07-01 DIAGNOSIS — Z20822 Contact with and (suspected) exposure to covid-19: Secondary | ICD-10-CM | POA: Diagnosis not present

## 2020-07-02 ENCOUNTER — Encounter: Payer: Self-pay | Admitting: Family Medicine

## 2020-07-02 ENCOUNTER — Telehealth (INDEPENDENT_AMBULATORY_CARE_PROVIDER_SITE_OTHER): Payer: BC Managed Care – PPO | Admitting: Family Medicine

## 2020-07-02 VITALS — Temp 100.2°F | Wt 138.0 lb

## 2020-07-02 DIAGNOSIS — Z20822 Contact with and (suspected) exposure to covid-19: Secondary | ICD-10-CM

## 2020-07-02 DIAGNOSIS — R509 Fever, unspecified: Secondary | ICD-10-CM

## 2020-07-02 DIAGNOSIS — R05 Cough: Secondary | ICD-10-CM

## 2020-07-02 DIAGNOSIS — R059 Cough, unspecified: Secondary | ICD-10-CM

## 2020-07-02 MED ORDER — AZITHROMYCIN 250 MG PO TABS: ORAL_TABLET | ORAL | 0 refills | Status: DC

## 2020-07-02 MED ORDER — METHYLPREDNISOLONE 4 MG PO TBPK: ORAL_TABLET | ORAL | 0 refills | Status: DC

## 2020-07-02 NOTE — Progress Notes (Signed)
Subjective:    Patient ID: Allen Ellis, male    DOB: [birth year], 49 y.o.   MRN: [id number]  HPI Virtual Visit via Video Note  I connected with the patient on 07/02/20 at  4:00 PM EDT by a video enabled telemedicine application and verified that I am speaking with the correct person using two identifiers.  Location patient: home Location provider:work or home office Persons participating in the virtual visit: patient, provider  I discussed the limitations of evaluation and management by telemedicine and the availability of in person appointments. The patient expressed understanding and agreed to proceed.   HPI: Here for what is likely a Covid-19 infection. His ex-wife and his children have all tested positive for the virus. He was tested this morning and is waiting on results. He has had 5 days of fever, headaches, body aches, coughing up green sputum, pain in the back between the shoulder blades, and SOB. No NVD. He is drinking fluids and taking Tylenol. He thinks he would be a candidate for monoclonal antibodies because he has a hx of thyroid cancer and he has chronic hepatitis B.    ROS: See pertinent positives and negatives per HPI.  Past Medical History:  Diagnosis Date  . Chronic hepatitis B ([place])   . Hemorrhoids   . Prediabetes   . Thyroid cancer [facility])     Past Surgical History:  Procedure Laterality Date  . COLONOSCOPY    . HEMORRHOID SURGERY    . TOTAL THYROIDECTOMY      Family History  Problem Relation Age of Onset  . Heart disease Mother   . Stomach cancer Mother   . Early death Mother   . Hyperlipidemia Mother   . Heart attack Father   . Kidney cancer Father        mets   . Diabetes Father   . Heart disease Father 108  . Diabetes Sister   . Breast cancer Paternal Grandmother        mets  . Colon cancer Neg Hx   . Esophageal cancer Neg Hx      Current Outpatient Medications:  .  azithromycin (ZITHROMAX Z-PAK) 250 MG tablet, As directed, Disp: 6 each,  Rfl: 0 .  b complex vitamins tablet, Take 1 tablet by mouth daily., Disp: 100 tablet, Rfl: 3 .  Cholecalciferol (VITAMIN D3) 50 MCG (2000 UT) capsule, Take 1 capsule (2,000 Units total) by mouth daily., Disp: 100 capsule, Rfl: 3 .  clobetasol cream (TEMOVATE) 4.19 %, Apply 1 application topically 2 (two) times daily., Disp: 60 g, Rfl: 3 .  methylPREDNISolone (MEDROL DOSEPAK) 4 MG TBPK tablet, As directed, Disp: 21 tablet, Rfl: 0 .  SYNTHROID 112 MCG tablet, TAKE 1 TABLET BY MOUTH EVERY DAY BEFORE BREAKFAST, Disp: 90 tablet, Rfl: 3 .  valACYclovir (VALTREX) 500 MG tablet, Take 1 tablet (500 mg total) by mouth 2 (two) times daily., Disp: 14 tablet, Rfl: 3  EXAM:  VITALS per patient if applicable:  GENERAL: alert, oriented, appears well and in no acute distress  HEENT: atraumatic, conjunttiva clear, no obvious abnormalities on inspection of external nose and ears  NECK: normal movements of the head and neck  LUNGS: on inspection no signs of respiratory distress, breathing rate appears normal, no obvious gross SOB, gasping or wheezing  CV: no obvious cyanosis  MS: moves all visible extremities without noticeable abnormality  PSYCH/NEURO: pleasant and cooperative, no obvious depression or anxiety, speech and thought processing grossly intact  ASSESSMENT AND PLAN: He  likely has a Covid-19 infection and is probably developing an early pneumonia. We will send him for a CXR this afternoon. Treat with a Zpack and a Medrol dose pack. I referred him to the [facility] infusion clinic as well.  Alysia Penna, MD  Discussed the following assessment and plan:  Cough - Plan: DG Chest 2 View     I discussed the assessment and treatment plan with the patient. The patient was provided an opportunity to ask questions and all were answered. The patient agreed with the plan and demonstrated an understanding of the instructions.   The patient was advised to call back or seek an in-person evaluation if the  symptoms worsen or if the condition fails to improve as anticipated.     Review of Systems     Objective:   Physical Exam        Assessment & Plan:

## 2020-07-03 ENCOUNTER — Other Ambulatory Visit: Payer: Self-pay

## 2020-07-03 ENCOUNTER — Ambulatory Visit (INDEPENDENT_AMBULATORY_CARE_PROVIDER_SITE_OTHER)
Admission: RE | Admit: 2020-07-03 | Discharge: 2020-07-03 | Disposition: A | Discharge status: Home / Self Care | Payer: BC Managed Care – PPO | Source: Ambulatory Visit | Attending: Family Medicine | Admitting: Family Medicine

## 2020-07-03 DIAGNOSIS — R911 Solitary pulmonary nodule: Secondary | ICD-10-CM | POA: Diagnosis not present

## 2020-07-03 DIAGNOSIS — R0602 Shortness of breath: Secondary | ICD-10-CM | POA: Diagnosis not present

## 2020-07-03 DIAGNOSIS — R059 Cough, unspecified: Secondary | ICD-10-CM

## 2020-07-03 DIAGNOSIS — R05 Cough: Secondary | ICD-10-CM

## 2020-07-03 NOTE — Telephone Encounter (Addendum)
He had a VOV w/Dr Sarajane Jews yesterday  Thx

## 2020-07-04 ENCOUNTER — Other Ambulatory Visit (HOSPITAL_COMMUNITY): Payer: Self-pay | Admitting: Nurse Practitioner

## 2020-07-04 DIAGNOSIS — U071 COVID-19: Secondary | ICD-10-CM

## 2020-07-04 DIAGNOSIS — C73 Malignant neoplasm of thyroid gland: Secondary | ICD-10-CM

## 2020-07-04 NOTE — Progress Notes (Signed)
I connected by phone with Allen Ellis on 07/04/2020 at 2:01 PM to discuss the potential use of a new treatment for mild to moderate COVID-19 viral infection in non-hospitalized patients.  This patient is a 49 y.o. male that meets the FDA criteria for Emergency Use Authorization of COVID monoclonal antibody casirivimab/imdevimab.  Has a (+) direct SARS-CoV-2 viral test result  Has mild or moderate COVID-19   Is NOT hospitalized due to COVID-19  Is within 10 days of symptom onset  Has at least one of the high risk factor(s) for progression to severe COVID-19 and/or hospitalization as defined in EUA.  Specific high risk criteria : Immunosuppressive Disease or Treatment   I have spoken and communicated the following to the patient or parent/caregiver regarding COVID monoclonal antibody treatment:  1. FDA has authorized the emergency use for the treatment of mild to moderate COVID-19 in adults and pediatric patients with positive results of direct SARS-CoV-2 viral testing who are 18 years of age and older weighing at least 40 kg, and who are at high risk for progressing to severe COVID-19 and/or hospitalization.  2. The significant known and potential risks and benefits of COVID monoclonal antibody, and the extent to which such potential risks and benefits are unknown.  3. Information on available alternative treatments and the risks and benefits of those alternatives, including clinical trials.  4. Patients treated with COVID monoclonal antibody should continue to self-isolate and use infection control measures (e.g., wear mask, isolate, social distance, avoid sharing personal items, clean and disinfect "high touch" surfaces, and frequent handwashing) according to CDC guidelines.   5. The patient or parent/caregiver has the option to accept or refuse COVID monoclonal antibody treatment.  After reviewing this information with the patient, The patient agreed to proceed with receiving  casirivimab\imdevimab infusion and will be provided a copy of the Fact sheet prior to receiving the infusion. Joiner 07/04/2020 2:01 PM

## 2020-07-05 ENCOUNTER — Ambulatory Visit (HOSPITAL_COMMUNITY): Payer: BC Managed Care – PPO

## 2020-07-05 ENCOUNTER — Encounter (HOSPITAL_COMMUNITY): Payer: Self-pay

## 2020-07-05 ENCOUNTER — Telehealth: Payer: Self-pay | Admitting: Family

## 2020-07-05 NOTE — Telephone Encounter (Signed)
Allen Ellis arrived to the [facility] to receive Regeneron for COVID-19 infection. Upon reading through the material he had concerns about the benefits after 7 days of receiving the medication as he has been feeling better with each passing day. We discussed the risks for potential of worsening of symptoms and that treatment beyond today would not likely to produce much benefit. He appears to have minimal symptoms at present and wished to not receive the medication today. He was discharged from the infusion center without complication. Advised to continue to follow up with PCP as needed.

## 2020-09-07 DIAGNOSIS — Z20822 Contact with and (suspected) exposure to covid-19: Secondary | ICD-10-CM | POA: Diagnosis not present

## 2020-09-07 DIAGNOSIS — Z03818 Encounter for observation for suspected exposure to other biological agents ruled out: Secondary | ICD-10-CM | POA: Diagnosis not present

## 2020-09-24 IMAGING — US ULTRASOUND ABDOMEN COMPLETE
1 series · 14 of 25 positions shown · non-contrast
Comparison: Ultrasound October 05, 2018.

CLINICAL DATA: Hepatic cirrhosis.

EXAM:
ABDOMEN ULTRASOUND COMPLETE

[Series 1: ultrasound abdomen complete · 0.23mm/px · 14 of 90 slices shown]
[im 1/90]
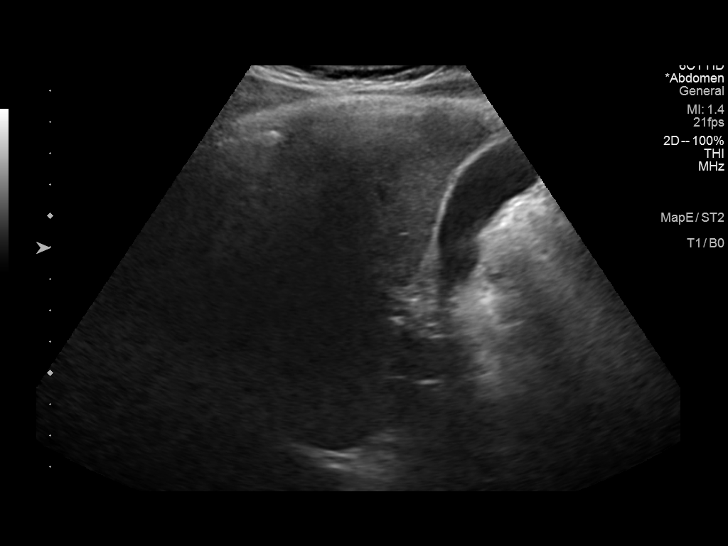
[im 8/90]
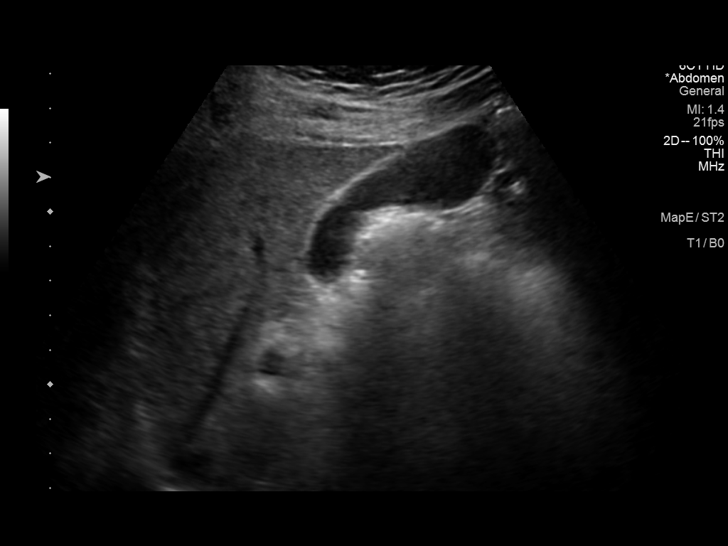
[im 15/90]
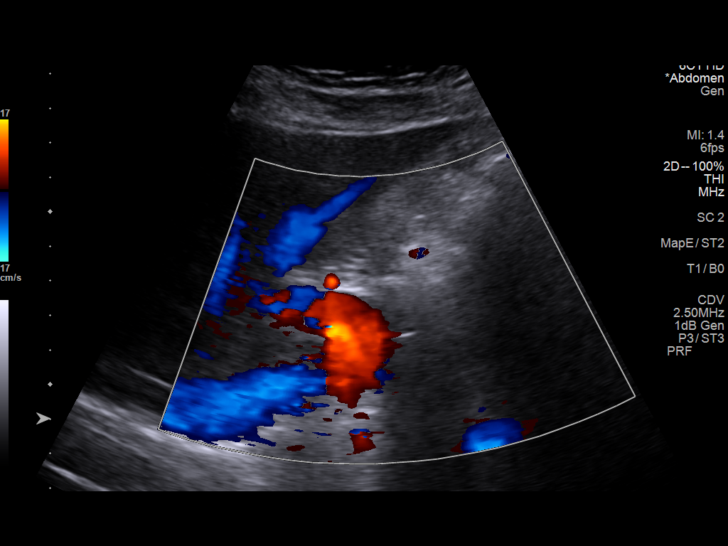
[im 23/90]
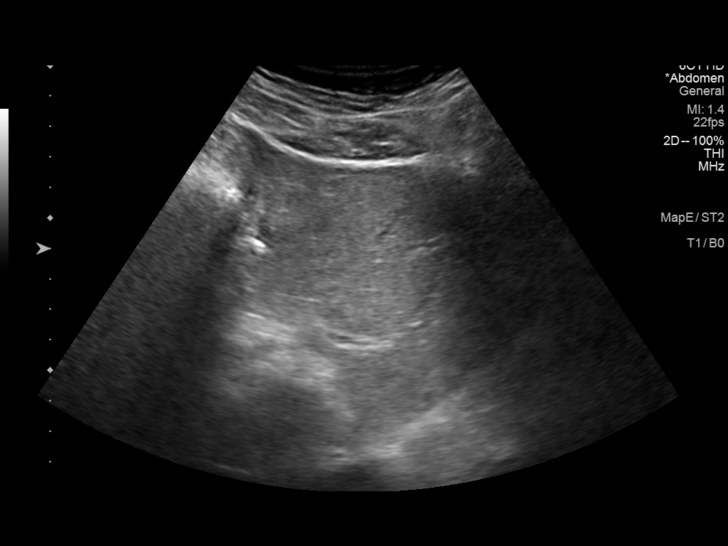
[im 30/90]
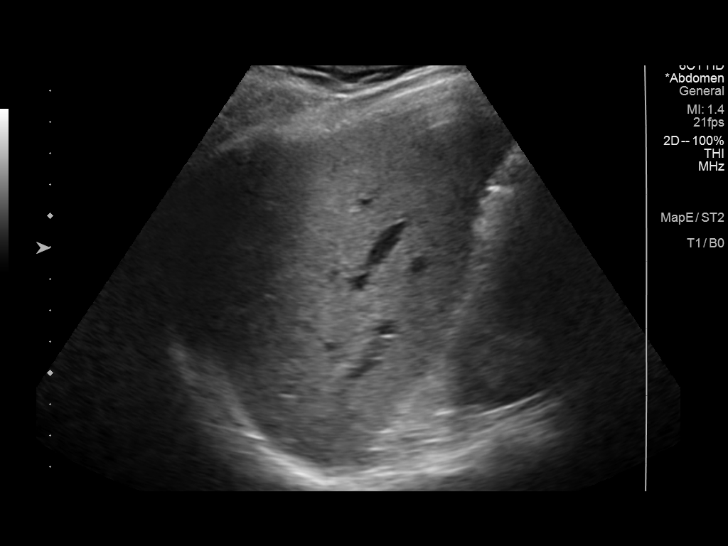
[im 34/90]
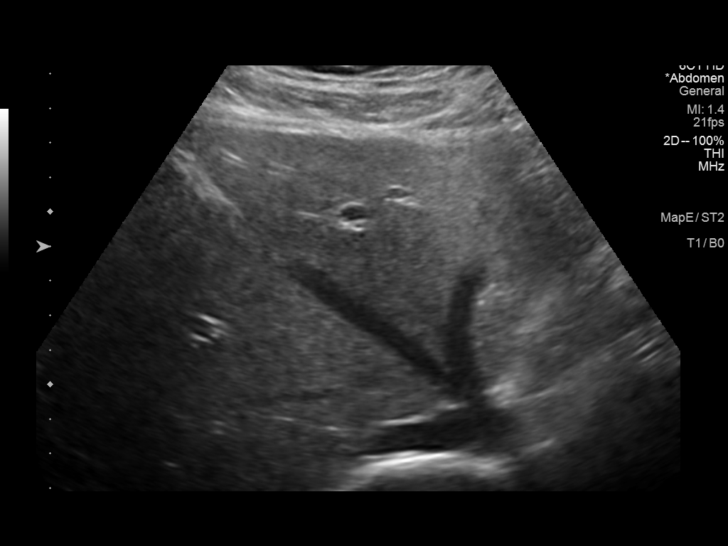
[im 41/90]
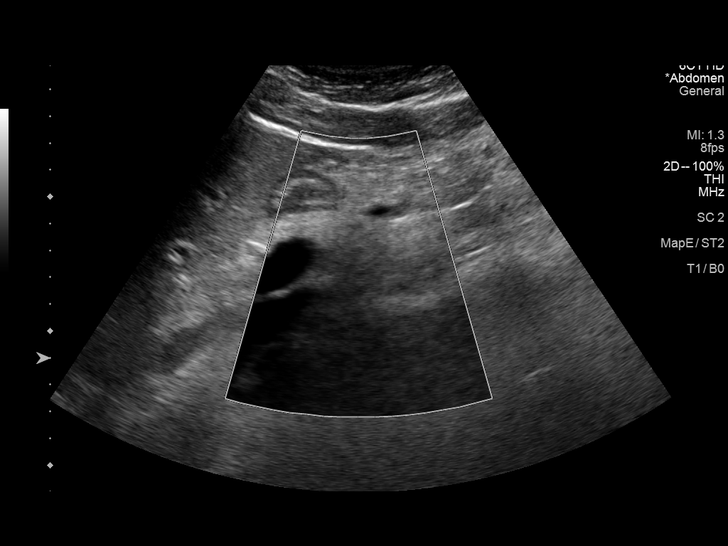
[im 49/90]
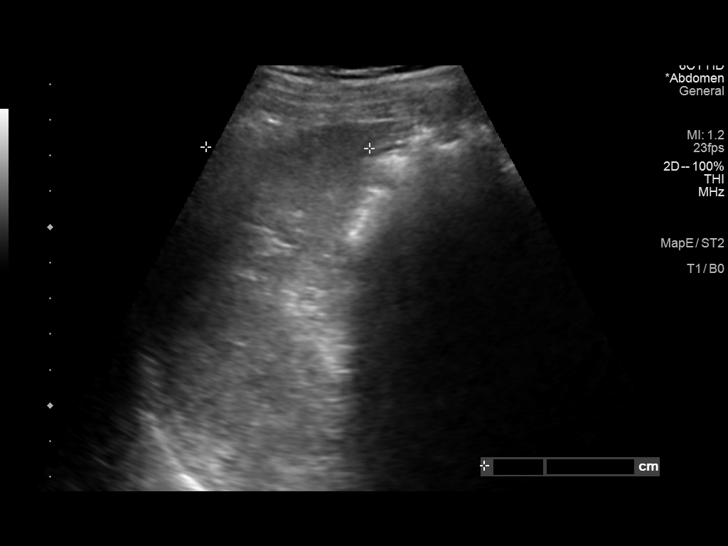
[im 56/90]
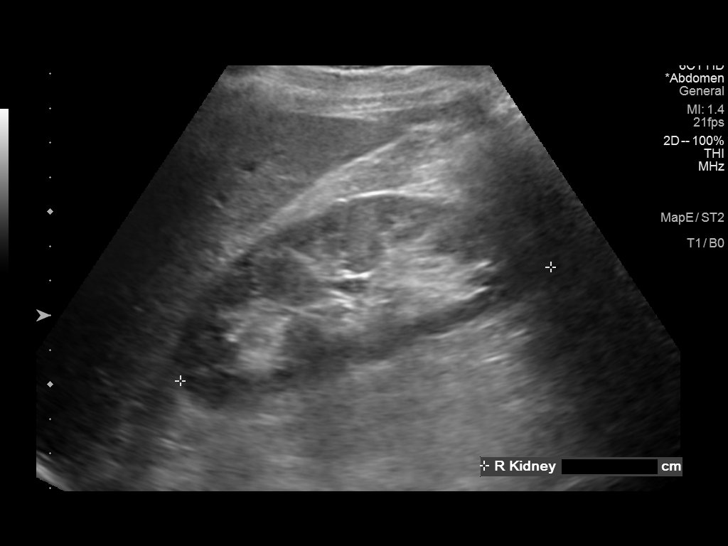
[im 60/90]
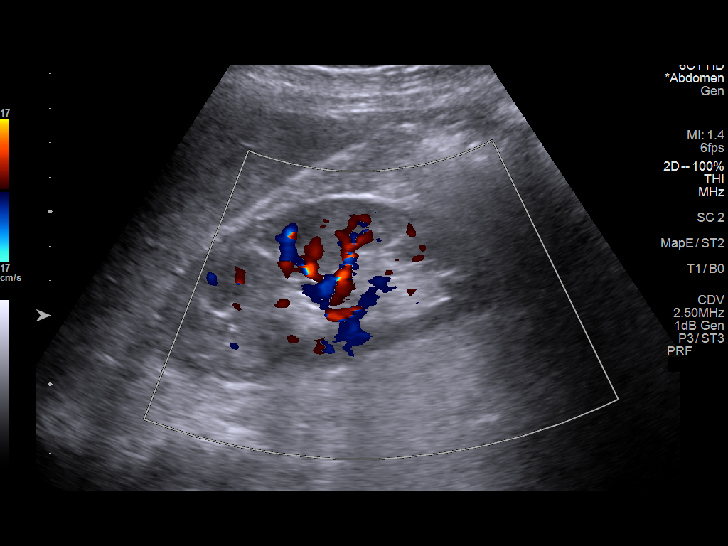
[im 67/90]
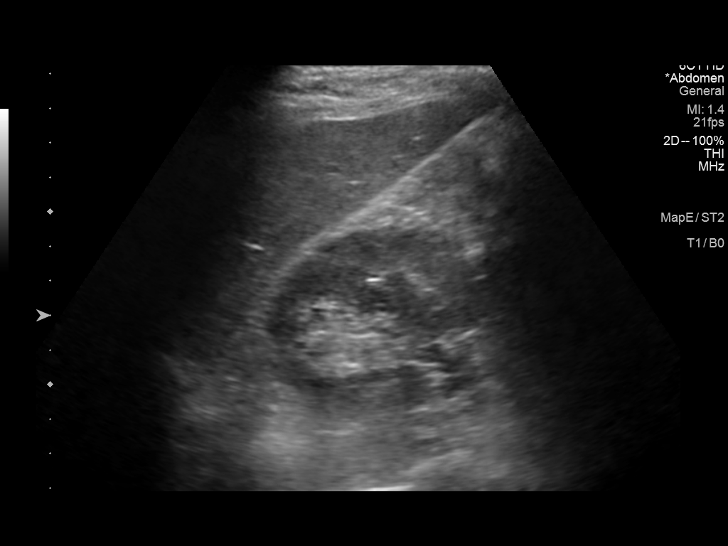
[im 75/90]
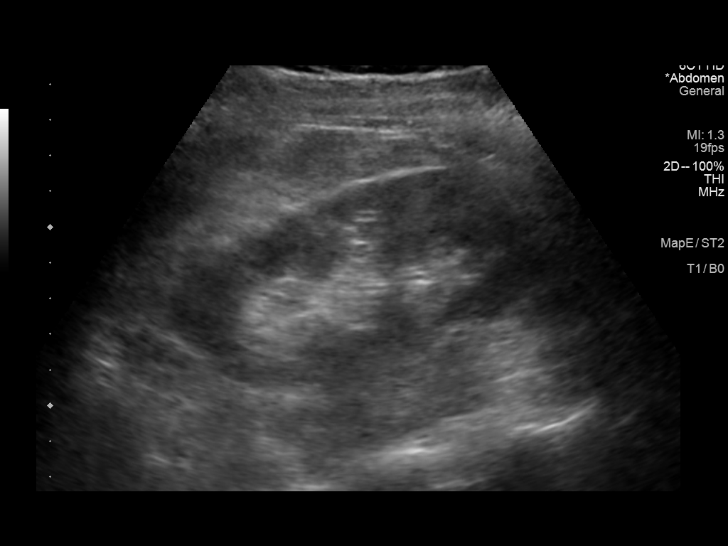
[im 82/90]
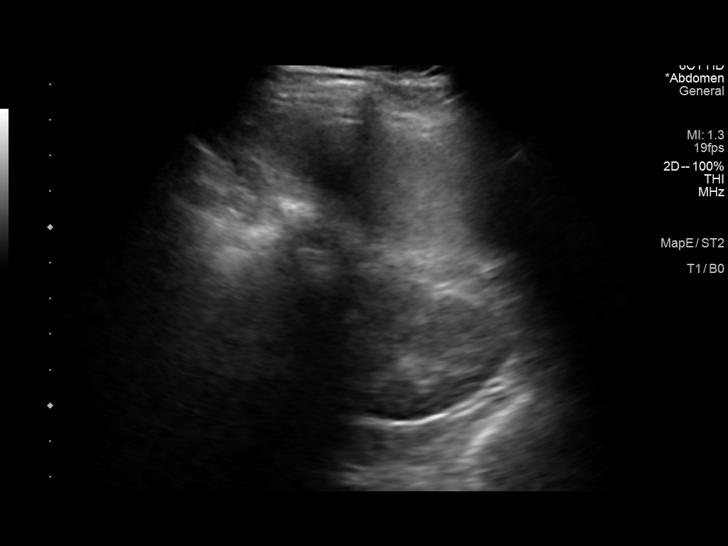
[im 90/90]
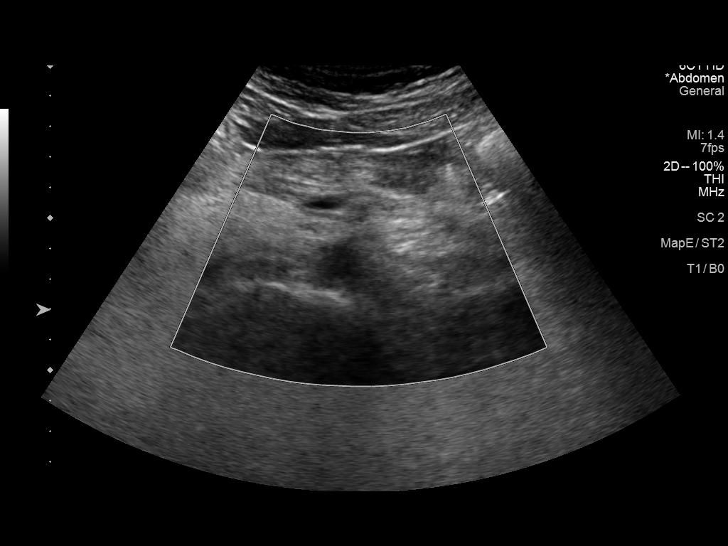

[14 of 25 positions shown; findings below may reference images not displayed]

FINDINGS: Gallbladder: No gallstones or wall thickening visualized. No
sonographic Murphy sign noted by sonographer.

Common bile duct: Diameter: 2 mm which is within normal limits.

Liver: No focal lesion identified. Within normal limits in
parenchymal echogenicity. Portal vein is patent on color Doppler
imaging with normal direction of blood flow towards the liver.

IVC: No abnormality visualized.

Pancreas: Visualized portion unremarkable.

Spleen: Size and appearance within normal limits.

Right Kidney: Length: 11.2 cm. Echogenicity within normal limits. No
mass or hydronephrosis visualized.

Left Kidney: Length: 11.2 cm. Echogenicity within normal limits. No
mass or hydronephrosis visualized.

Abdominal aorta: No aneurysm visualized.

Other findings: None.
IMPRESSION: No abnormality seen in the abdomen.

## 2020-11-18 ENCOUNTER — Other Ambulatory Visit: Payer: Self-pay

## 2020-11-18 ENCOUNTER — Encounter: Payer: Self-pay | Admitting: Internal Medicine

## 2020-11-18 ENCOUNTER — Ambulatory Visit: Payer: BC Managed Care – PPO | Admitting: Internal Medicine

## 2020-11-18 VITALS — BP 118/74 | HR 74 | Temp 97.7°F | Ht 68.75 in | Wt 148.0 lb

## 2020-11-18 DIAGNOSIS — E890 Postprocedural hypothyroidism: Secondary | ICD-10-CM | POA: Diagnosis not present

## 2020-11-18 DIAGNOSIS — R739 Hyperglycemia, unspecified: Secondary | ICD-10-CM

## 2020-11-18 DIAGNOSIS — B009 Herpesviral infection, unspecified: Secondary | ICD-10-CM | POA: Diagnosis not present

## 2020-11-18 DIAGNOSIS — C73 Malignant neoplasm of thyroid gland: Secondary | ICD-10-CM | POA: Diagnosis not present

## 2020-11-18 DIAGNOSIS — Z0000 Encounter for general adult medical examination without abnormal findings: Secondary | ICD-10-CM | POA: Diagnosis not present

## 2020-11-18 DIAGNOSIS — G500 Trigeminal neuralgia: Secondary | ICD-10-CM | POA: Diagnosis not present

## 2020-11-18 DIAGNOSIS — Z8249 Family history of ischemic heart disease and other diseases of the circulatory system: Secondary | ICD-10-CM

## 2020-11-18 DIAGNOSIS — R5382 Chronic fatigue, unspecified: Secondary | ICD-10-CM

## 2020-11-18 DIAGNOSIS — B181 Chronic viral hepatitis B without delta-agent: Secondary | ICD-10-CM

## 2020-11-18 DIAGNOSIS — G4459 Other complicated headache syndrome: Secondary | ICD-10-CM

## 2020-11-18 MED ORDER — LEVOTHYROXINE SODIUM 112 MCG PO TABS: ORAL_TABLET | ORAL | 3 refills | Status: DC

## 2020-11-18 MED ORDER — VALACYCLOVIR HCL 500 MG PO TABS: ORAL_TABLET | ORAL | 3 refills | Status: DC

## 2020-11-18 MED ORDER — CARBAMAZEPINE 200 MG PO TABS: 200.0000 mg | ORAL_TABLET | Freq: Two times a day (BID) | ORAL | 3 refills | Status: DC

## 2020-11-18 NOTE — Progress Notes (Signed)
Subjective:  Patient ID: Allen Ellis, male    DOB: 10-05-1971  Age: 50 y.o. MRN: [id number]  CC: No chief complaint on file.   HPI Allen Ellis presents for R temple lancing pain - all his life usually 2 short living episodes per year.  This time he has been having multiple episodes on daily basis for 1 month which is very unusual for him.  They were more severe.  They  stopped 1 week ago.  He did not use any meds for the headache. F/u Hep B, dyslipidemia She has been on a strict keto diet and intermittent fasting for 1 year.  Feeling good   Outpatient Medications Prior to Visit  Medication Sig Dispense Refill  . azithromycin (ZITHROMAX Z-PAK) 250 MG tablet As directed 6 each 0  . b complex vitamins tablet Take 1 tablet by mouth daily. 100 tablet 3  . Cholecalciferol (VITAMIN D3) 50 MCG (2000 UT) capsule Take 1 capsule (2,000 Units total) by mouth daily. 100 capsule 3  . clobetasol cream (TEMOVATE) 5.46 % Apply 1 application topically 2 (two) times daily. 60 g 3  . methylPREDNISolone (MEDROL DOSEPAK) 4 MG TBPK tablet As directed 21 tablet 0  . SYNTHROID 112 MCG tablet TAKE 1 TABLET BY MOUTH EVERY DAY BEFORE BREAKFAST 90 tablet 3  . valACYclovir (VALTREX) 500 MG tablet Take 1 tablet (500 mg total) by mouth 2 (two) times daily. 14 tablet 3   No facility-administered medications prior to visit.    ROS: Review of Systems  Constitutional: Negative for appetite change, fatigue and unexpected weight change.  HENT: Negative for congestion, nosebleeds, sneezing, sore throat and trouble swallowing.   Eyes: Negative for itching and visual disturbance.  Respiratory: Negative for cough.   Cardiovascular: Negative for chest pain, palpitations and leg swelling.  Gastrointestinal: Negative for abdominal distention, blood in stool, diarrhea and nausea.  Genitourinary: Negative for frequency and hematuria.  Musculoskeletal: Negative for back pain, gait problem, joint swelling and neck pain.  Skin:  Negative for rash.  Neurological: Positive for headaches. Negative for dizziness, tremors, speech difficulty and weakness.  Psychiatric/Behavioral: Negative for agitation, dysphoric mood and sleep disturbance. The patient is not nervous/anxious.     Objective:  BP 118/74   Pulse 74   Temp 97.7 F (36.5 C) (Oral)   Ht 5' 8.75" (1.746 m)   Wt 148 lb (67.1 kg)   SpO2 98%   BMI 22.02 kg/m   BP Readings from Last 3 Encounters:  11/18/20 118/74  06/04/20 102/68  12/18/19 118/69    Wt Readings from Last 3 Encounters:  11/18/20 148 lb (67.1 kg)  07/02/20 138 lb (62.6 kg)  06/04/20 142 lb (64.4 kg)    Physical Exam Constitutional:      General: He is not in acute distress.    Appearance: He is well-developed.     Comments: NAD  HENT:     Mouth/Throat:     Mouth: Oropharynx is clear and moist.  Eyes:     Conjunctiva/sclera: Conjunctivae normal.     Pupils: Pupils are equal, round, and reactive to light.  Neck:     Thyroid: No thyromegaly.     Vascular: No JVD.  Cardiovascular:     Rate and Rhythm: Normal rate and regular rhythm.     Pulses: Intact distal pulses.     Heart sounds: Normal heart sounds. No murmur heard. No friction rub. No gallop.   Pulmonary:     Effort: Pulmonary effort is normal. No  respiratory distress.     Breath sounds: Normal breath sounds. No wheezing or rales.  Chest:     Chest wall: No tenderness.  Abdominal:     General: Bowel sounds are normal. There is no distension.     Palpations: Abdomen is soft. There is no mass.     Tenderness: There is no abdominal tenderness. There is no guarding or rebound.  Musculoskeletal:        General: No tenderness or edema. Normal range of motion.     Cervical back: Normal range of motion.  Lymphadenopathy:     Cervical: No cervical adenopathy.  Skin:    General: Skin is warm and dry.     Findings: No rash.  Neurological:     Mental Status: He is alert and oriented to person, place, and time.      Cranial Nerves: No cranial nerve deficit.     Motor: No abnormal muscle tone.     Coordination: He displays a negative Romberg sign. Coordination normal.     Gait: Gait normal.     Deep Tendon Reflexes: Reflexes are normal and symmetric.  Psychiatric:        Mood and Affect: Mood and affect normal.        Behavior: Behavior normal.        Thought Content: Thought content normal.        Judgment: Judgment normal.     A total time of >45 minutes was spent preparing to see the patient, reviewing tests, x-rays, operative reports and outside records.  Also, obtaining history and performing comprehensive physical exam.  Additionally, counseling the patient regarding the above listed issues.   Finally, documenting clinical information in the health records, coordination of care, educating the patient. It is a complex case.   Lab Results  Component Value Date   WBC 5.5 06/04/2020   HGB 13.1 (L) 06/04/2020   HCT 42.4 06/04/2020   PLT 306 06/04/2020   GLUCOSE 74 06/04/2020   CHOL 229 (H) 06/04/2020   TRIG 46 06/04/2020   HDL 74 06/04/2020   LDLCALC 141 (H) 06/04/2020   ALT 24 06/04/2020   AST 17 06/04/2020   NA 137 06/04/2020   K 4.4 06/04/2020   CL 99 06/04/2020   CREATININE 0.90 06/04/2020   BUN 14 06/04/2020   CO2 26 06/04/2020   TSH 0.04 (L) 06/04/2020   PSA 2.1 06/04/2020   INR 1.3 (H) 05/07/2019   HGBA1C 5.2 06/04/2020    DG Chest 2 View  Result Date: 07/04/2020 CLINICAL DATA:  50 year old male with productive cough, fever, shortness of breath and back pain. Recent exposure to COVID. EXAM: CHEST - 2 VIEW COMPARISON:  Prior chest x-ray 03/29/2017 FINDINGS: The lungs are clear and negative for focal airspace consolidation, pulmonary edema or suspicious pulmonary nodule. No pleural effusion or pneumothorax. Cardiac and mediastinal contours are within normal limits. No acute fracture or lytic or blastic osseous lesions. The visualized upper abdominal bowel gas pattern is  unremarkable. IMPRESSION: Negative chest x-ray. Electronically Signed   By: Jacqulynn Cadet M.D.   On: 07/04/2020 15:33    Assessment & Plan:    Walker Kehr, MD

## 2020-11-18 NOTE — Assessment & Plan Note (Addendum)
Worse.  Recurrent in the R temple Treatment options discussed.  Medrol pack and Tegretol prescriptions provided in case of the recurrence

## 2020-11-18 NOTE — Assessment & Plan Note (Addendum)
Continue with Synthroid Obtain TSH

## 2020-11-18 NOTE — Assessment & Plan Note (Signed)
On Keto diet

## 2020-11-18 NOTE — Assessment & Plan Note (Addendum)
CT calcium CT ordered - pt refused due to radiation fear

## 2020-11-18 NOTE — Assessment & Plan Note (Addendum)
Valtrex prn  

## 2020-11-18 NOTE — Assessment & Plan Note (Addendum)
Obtain TSH °

## 2020-11-19 DIAGNOSIS — B181 Chronic viral hepatitis B without delta-agent: Secondary | ICD-10-CM | POA: Diagnosis not present

## 2020-11-19 DIAGNOSIS — Z0000 Encounter for general adult medical examination without abnormal findings: Secondary | ICD-10-CM | POA: Diagnosis not present

## 2020-11-19 LAB — COMPREHENSIVE METABOLIC PANEL
ALT: 25 U/L (ref 0–53)
AST: 20 U/L (ref 0–37)
Albumin: 5 g/dL (ref 3.5–5.2)
Alkaline Phosphatase: 54 U/L (ref 39–117)
BUN: 14 mg/dL (ref 6–23)
CO2: 31 mEq/L (ref 19–32)
Calcium: 10.2 mg/dL (ref 8.4–10.5)
Chloride: 99 mEq/L (ref 96–112)
Creatinine, Ser: 0.94 mg/dL (ref 0.40–1.50)
GFR: 95.58 mL/min (ref >60.00)
Glucose, Bld: 86 mg/dL (ref 70–99)
Potassium: 4.2 mEq/L (ref 3.5–5.1)
Sodium: 137 mEq/L (ref 135–145)
Total Bilirubin: 0.5 mg/dL (ref 0.2–1.2)
Total Protein: 7.9 g/dL (ref 6.0–8.3)

## 2020-11-19 LAB — LIPID PANEL
Cholesterol: 215 mg/dL — ABNORMAL HIGH (ref 0–200)
HDL: 69.1 mg/dL (ref >39.00)
LDL Cholesterol: 138 mg/dL — ABNORMAL HIGH (ref 0–99)
NonHDL: 146.14
Total CHOL/HDL Ratio: 3
Triglycerides: 40 mg/dL (ref 0.0–149.0)
VLDL: 8 mg/dL (ref 0.0–40.0)

## 2020-11-19 LAB — CBC WITH DIFFERENTIAL/PLATELET
Basophils Absolute: 0.1 10*3/uL (ref 0.0–0.1)
Basophils Relative: 1.4 % (ref 0.0–3.0)
Eosinophils Absolute: 0 10*3/uL (ref 0.0–0.7)
Eosinophils Relative: 0.7 % (ref 0.0–5.0)
HCT: 43.6 % (ref 39.0–52.0)
Hemoglobin: 14.2 g/dL (ref 13.0–17.0)
Lymphocytes Relative: 43 % (ref 12.0–46.0)
Lymphs Abs: 1.5 10*3/uL (ref 0.7–4.0)
MCHC: 32.7 g/dL (ref 30.0–36.0)
MCV: 73.2 fl — ABNORMAL LOW (ref 78.0–100.0)
Monocytes Absolute: 0.3 10*3/uL (ref 0.1–1.0)
Monocytes Relative: 9.2 % (ref 3.0–12.0)
Neutro Abs: 1.6 10*3/uL (ref 1.4–7.7)
Neutrophils Relative %: 45.7 % (ref 43.0–77.0)
Platelets: 269 10*3/uL (ref 150.0–400.0)
RBC: 5.95 Mil/uL — ABNORMAL HIGH (ref 4.22–5.81)
RDW: 13.5 % (ref 11.5–15.5)
WBC: 3.5 10*3/uL — ABNORMAL LOW (ref 4.0–10.5)

## 2020-11-19 LAB — TSH: TSH: 0.45 u[IU]/mL (ref 0.35–4.50)

## 2020-11-19 LAB — URINALYSIS
Bilirubin Urine: NEGATIVE
Hgb urine dipstick: NEGATIVE
Ketones, ur: 15 — AB
Leukocytes,Ua: NEGATIVE
Nitrite: NEGATIVE
Specific Gravity, Urine: 1.015 (ref 1.000–1.030)
Total Protein, Urine: NEGATIVE
Urine Glucose: NEGATIVE
Urobilinogen, UA: 0.2 (ref 0.0–1.0)
pH: 7 (ref 5.0–8.0)

## 2020-11-19 LAB — T4, FREE: Free T4: 1.16 ng/dL (ref 0.60–1.60)

## 2020-11-19 LAB — T3, FREE: T3, Free: 3.5 pg/mL (ref 2.3–4.2)

## 2020-11-19 LAB — HEMOGLOBIN A1C: Hgb A1c MFr Bld: 5.7 % (ref 4.6–6.5)

## 2020-11-19 LAB — VITAMIN D 25 HYDROXY (VIT D DEFICIENCY, FRACTURES): VITD: 41.5 ng/mL (ref 30.00–100.00)

## 2020-11-19 LAB — VITAMIN B12: Vitamin B-12: 643 pg/mL (ref 211–911)

## 2020-11-19 LAB — PSA: PSA: 2.66 ng/mL (ref 0.10–4.00)

## 2020-11-22 LAB — HEPATITIS B DNA, ULTRAQUANTITATIVE, PCR
Hepatitis B DNA (Calc): <1 Log IU/mL
Hepatitis B DNA: <10 IU/mL

## 2020-11-22 LAB — AFP TUMOR MARKER: AFP-Tumor Marker: 2.2 ng/mL (ref <6.1)

## 2020-11-23 NOTE — Assessment & Plan Note (Signed)
Much better on keto diet

## 2020-11-23 NOTE — Assessment & Plan Note (Signed)
Ordered LFTs,  AFP

## 2020-11-28 ENCOUNTER — Telehealth: Payer: Self-pay | Admitting: Internal Medicine

## 2020-11-28 NOTE — Telephone Encounter (Signed)
Patient wondering if he can get his recent lab work mailed to him, address on file is fine.

## 2020-11-28 NOTE — Telephone Encounter (Signed)
Printed labs.. mailed to address on file.Marland KitchenJohny Chess

## 2020-12-01 ENCOUNTER — Other Ambulatory Visit: Payer: Self-pay | Admitting: Internal Medicine

## 2020-12-02 ENCOUNTER — Encounter: Payer: Self-pay | Admitting: Internal Medicine

## 2021-03-13 ENCOUNTER — Other Ambulatory Visit: Payer: Self-pay

## 2021-03-13 ENCOUNTER — Ambulatory Visit (INDEPENDENT_AMBULATORY_CARE_PROVIDER_SITE_OTHER): Payer: BC Managed Care – PPO | Admitting: Endocrinology

## 2021-03-13 VITALS — BP 110/70 | HR 64 | Ht 68.75 in | Wt 144.2 lb

## 2021-03-13 DIAGNOSIS — C73 Malignant neoplasm of thyroid gland: Secondary | ICD-10-CM | POA: Diagnosis not present

## 2021-03-13 DIAGNOSIS — R739 Hyperglycemia, unspecified: Secondary | ICD-10-CM | POA: Diagnosis not present

## 2021-03-13 DIAGNOSIS — E890 Postprocedural hypothyroidism: Secondary | ICD-10-CM

## 2021-03-13 LAB — HEMOGLOBIN A1C: Hgb A1c MFr Bld: 5.5 % (ref 4.6–6.5)

## 2021-03-13 LAB — T4, FREE: Free T4: 1.01 ng/dL (ref 0.60–1.60)

## 2021-03-13 LAB — TSH: TSH: 0.36 u[IU]/mL (ref 0.35–4.50)

## 2021-03-13 NOTE — Patient Instructions (Addendum)
Blood tests are requested for you today.  We'll let you know about the results.  Your chances of getting diabetes are 10% per year.  Diet and exercise cuts this to 5%.   Please come back for a follow-up appointment in 6-12 months.

## 2021-03-13 NOTE — Progress Notes (Signed)
Subjective:    Patient ID: Allen Ellis, male    DOB: 1971-09-17, 50 y.o.   MRN: [id number]  HPI Pt returns for f/u of stage 1 PTC, with this chronology:  5/12; total thyroidectomy with RLND: multimicrofocal PTC, with no vasc ext, but right extrathyr ext.  11/13 R cent nodes pos (largest 14 mm), and 3/27 R lat nodes pos (largest 10 mm with extranodal extension) 7/12: RAI, 147 mCi 7/12: post-therapy scan: uptake at thyroid bed only 5/18: US neck: tiny nodules (<4 mm) at the right and left thyroid areas. 1/20: TG=0.2 (ab neg) 8/20 [place]: 2 normal LN's Pt also has hyperglycemia (dx'ed 2019).  He has not taken medication for this.  Main symptom is cold intolerance.   Past Medical History:  Diagnosis Date  . Chronic hepatitis B ([place])   . Hemorrhoids   . Prediabetes   . Thyroid cancer [facility])     Past Surgical History:  Procedure Laterality Date  . COLONOSCOPY    . HEMORRHOID SURGERY    . TOTAL THYROIDECTOMY      Social History   Socioeconomic History  . Marital status: Single    Spouse name: Not on file  . Number of children: 3  . Years of education: Not on file  . Highest education level: Not on file  Occupational History  . Occupation: pattern Metallurgist: [employer]  Tobacco Use  . Smoking status: Former Smoker    Types: Cigarettes    Quit date: 09/26/1995    Years since quitting: 25.4  . Smokeless tobacco: Never Used  Substance and Sexual Activity  . Alcohol use: Yes    Comment: occasional  . Drug use: Never  . Sexual activity: Not on file  Other Topics Concern  . Not on file  Social History Narrative   Single, 3 children, teenage son lives with him (born about 2002-3)   From [place] in [place] since 1993   Corporate treasurer at [employer], former smoker, no drugs   Social Determinants of Radio broadcast assistant Strain: Not on file  Food Insecurity: Not on file  Transportation Needs: Not on file  Physical Activity: Not on file  Stress: Not on  file  Social Connections: Not on file  Intimate Partner Violence: Not on file    Current Outpatient Medications on File Prior to Visit  Medication Sig Dispense Refill  . b complex vitamins tablet Take 1 tablet by mouth daily. 100 tablet 3  . carbamazepine (TEGRETOL) 200 MG tablet Take 1 tablet (200 mg total) by mouth in the morning and at bedtime. 60 tablet 3  . Cholecalciferol (VITAMIN D3) 50 MCG (2000 UT) capsule Take 1 capsule (2,000 Units total) by mouth daily. 100 capsule 3  . clobetasol cream (TEMOVATE) 4.27 % Apply 1 application topically 2 (two) times daily. 60 g 3  . levothyroxine (SYNTHROID) 112 MCG tablet TAKE 1 TABLET BY MOUTH EVERY DAY BEFORE BREAKFAST 90 tablet 3  . valACYclovir (VALTREX) 500 MG tablet 1 po bid prn rash 30 tablet 3   No current facility-administered medications on file prior to visit.    No Known Allergies  Family History  Problem Relation Age of Onset  . Heart disease Mother   . Stomach cancer Mother   . Early death Mother   . Hyperlipidemia Mother   . Heart attack Father   . Kidney cancer Father        mets   . Diabetes  Father   . Heart disease Father 29  . Diabetes Sister   . Breast cancer Paternal Grandmother        mets  . Colon cancer Neg Hx   . Esophageal cancer Neg Hx     BP 110/70 (BP Location: Right Arm, Patient Position: Sitting, Cuff Size: Normal)   Pulse 64   Ht 5' 8.75" (1.746 m)   Wt 144 lb 3.2 oz (65.4 kg)   SpO2 98%   BMI 21.45 kg/m    Review of Systems He has lost weight, due to his efforts    Objective:   Physical Exam VITAL SIGNS:  See vs page.   GENERAL: no distress.   Neck: a healed scar is present.  I do not appreciate a nodule in the thyroid or elsewhere in the neck.    Lab Results  Component Value Date   TSH 0.36 03/13/2021    Lab Results  Component Value Date   HGBA1C 5.5 03/13/2021       Assessment & Plan:  Hyperglycemia: stable off rx Hypothyroidism: well-controlled.  Please continue the  same synthroid.  PTC: recheck today.   Patient Instructions  Blood tests are requested for you today.  We'll let you know about the results.  Your chances of getting diabetes are 10% per year.  Diet and exercise cuts this to 5%.   Please come back for a follow-up appointment in 6-12 months.

## 2021-03-17 ENCOUNTER — Telehealth: Payer: Self-pay | Admitting: Endocrinology

## 2021-03-17 LAB — THYROGLOBULIN LEVEL: Thyroglobulin: 0.3 ng/mL — ABNORMAL LOW

## 2021-03-17 LAB — THYROGLOBULIN ANTIBODY: Thyroglobulin Ab: <1 IU/mL (ref <=1)

## 2021-03-17 NOTE — Telephone Encounter (Signed)
Patient Experiencing Extreme Cold and wants to know what can be done, feels like medication should be changed.    Call back # [phone]

## 2021-03-18 NOTE — Telephone Encounter (Signed)
Due to normal blood test, sxs are not thyroid-related

## 2021-03-19 NOTE — Telephone Encounter (Signed)
Message sent thru MyChart 

## 2021-04-22 ENCOUNTER — Encounter: Payer: Self-pay | Admitting: Internal Medicine

## 2021-04-22 ENCOUNTER — Telehealth: Payer: Self-pay | Admitting: Internal Medicine

## 2021-04-22 ENCOUNTER — Other Ambulatory Visit: Payer: Self-pay

## 2021-04-22 ENCOUNTER — Ambulatory Visit: Payer: BC Managed Care – PPO | Admitting: Internal Medicine

## 2021-04-22 VITALS — BP 122/80 | HR 56 | Temp 98.4°F | Resp 18 | Ht 68.75 in | Wt 145.2 lb

## 2021-04-22 DIAGNOSIS — R1013 Epigastric pain: Secondary | ICD-10-CM

## 2021-04-22 LAB — COMPREHENSIVE METABOLIC PANEL
ALT: 23 U/L (ref 0–53)
AST: 20 U/L (ref 0–37)
Albumin: 4.4 g/dL (ref 3.5–5.2)
Alkaline Phosphatase: 64 U/L (ref 39–117)
BUN: 20 mg/dL (ref 6–23)
CO2: 29 mEq/L (ref 19–32)
Calcium: 9.8 mg/dL (ref 8.4–10.5)
Chloride: 101 mEq/L (ref 96–112)
Creatinine, Ser: 0.95 mg/dL (ref 0.40–1.50)
GFR: 94.1 mL/min (ref >60.00)
Glucose, Bld: 110 mg/dL — ABNORMAL HIGH (ref 70–99)
Potassium: 4.5 mEq/L (ref 3.5–5.1)
Sodium: 137 mEq/L (ref 135–145)
Total Bilirubin: 0.4 mg/dL (ref 0.2–1.2)
Total Protein: 7.4 g/dL (ref 6.0–8.3)

## 2021-04-22 LAB — CBC WITH DIFFERENTIAL/PLATELET
Basophils Absolute: 0 10*3/uL (ref 0.0–0.1)
Basophils Relative: 0.5 % (ref 0.0–3.0)
Eosinophils Absolute: 0 10*3/uL (ref 0.0–0.7)
Eosinophils Relative: 0.4 % (ref 0.0–5.0)
HCT: 42.4 % (ref 39.0–52.0)
Hemoglobin: 13.8 g/dL (ref 13.0–17.0)
Lymphocytes Relative: 13.9 % (ref 12.0–46.0)
Lymphs Abs: 1.1 10*3/uL (ref 0.7–4.0)
MCHC: 32.5 g/dL (ref 30.0–36.0)
MCV: 73.9 fl — ABNORMAL LOW (ref 78.0–100.0)
Monocytes Absolute: 0.5 10*3/uL (ref 0.1–1.0)
Monocytes Relative: 6.7 % (ref 3.0–12.0)
Neutro Abs: 6 10*3/uL (ref 1.4–7.7)
Neutrophils Relative %: 78.5 % — ABNORMAL HIGH (ref 43.0–77.0)
Platelets: 259 10*3/uL (ref 150.0–400.0)
RBC: 5.74 Mil/uL (ref 4.22–5.81)
RDW: 14 % (ref 11.5–15.5)
WBC: 7.7 10*3/uL (ref 4.0–10.5)

## 2021-04-22 LAB — LIPASE: Lipase: 10 U/L — ABNORMAL LOW (ref 11.0–59.0)

## 2021-04-22 MED ORDER — ONDANSETRON 4 MG PO TBDP
4.0000 mg | ORAL_TABLET | Freq: Three times a day (TID) | ORAL | 0 refills | Status: DC | PRN
Start: 2021-04-22 — End: 2021-09-03

## 2021-04-22 MED ORDER — DICYCLOMINE HCL 10 MG PO CAPS: 10.0000 mg | ORAL_CAPSULE | Freq: Three times a day (TID) | ORAL | 0 refills | Status: DC

## 2021-04-22 NOTE — Patient Instructions (Signed)
We will check the labs today.   We have sent in zofran for nausea to take if needed.  We have sent in bentyl (dicyclomine) for the pain to use if needed. It is okay to use tylenol also.

## 2021-04-22 NOTE — Telephone Encounter (Signed)
[facility] FYI :  ---Caller states they ate bag of almonds and thinks they are stuck in stomach. Caller is having severe abdominal pain, started approx 3am.  Advised to be seen within 4 hours, patient has an appt at 1100 7.6.22 with Dr.Crawford

## 2021-04-22 NOTE — Progress Notes (Signed)
   Subjective:   Patient ID: Allen Ellis, male    DOB: [birth year], 50 y.o.   MRN: [id number]  HPI The patient is a 50 YO man coming in for severe abdominal pain. Started at 3 am and is doing intermittent fasting and eating only 1 meal per day. Feels he ate too large a meal and this bothered him. Had 4 BM today but not liquid or true diarrhea. No nausea or vomiting. No constipation. Has not taken anything for this. Overall slightly better. No fevers or chills.   Review of Systems  Constitutional:  Positive for appetite change.  HENT: Negative.    Eyes: Negative.   Respiratory:  Negative for cough, chest tightness and shortness of breath.   Cardiovascular:  Negative for chest pain, palpitations and leg swelling.  Gastrointestinal:  Positive for abdominal pain. Negative for abdominal distention, anal bleeding, blood in stool, constipation, diarrhea, nausea, rectal pain and vomiting.  Musculoskeletal: Negative.   Skin: Negative.   Neurological: Negative.   Psychiatric/Behavioral: Negative.     Objective:  Physical Exam Constitutional:      Appearance: He is well-developed.  HENT:     Head: Normocephalic and atraumatic.  Cardiovascular:     Rate and Rhythm: Normal rate and regular rhythm.  Pulmonary:     Effort: Pulmonary effort is normal. No respiratory distress.     Breath sounds: Normal breath sounds. No wheezing or rales.  Abdominal:     General: Bowel sounds are normal. There is no distension.     Palpations: Abdomen is soft.     Tenderness: There is generalized abdominal tenderness. There is no rebound.  Musculoskeletal:     Cervical back: Normal range of motion.  Skin:    General: Skin is warm and dry.  Neurological:     Mental Status: He is alert and oriented to person, place, and time.     Coordination: Coordination normal.    Vitals:   04/22/21 1058  BP: 122/80  Pulse: (!) 56  Resp: 18  Temp: 98.4 F (36.9 C)  TempSrc: Oral  SpO2: 98%  Weight: 145 lb 3.2 oz  (65.9 kg)  Height: 5' 8.75" (1.746 m)    This visit occurred during the SARS-CoV-2 public health emergency.  Safety protocols were in place, including screening questions prior to the visit, additional usage of staff PPE, and extensive cleaning of exam room while observing appropriate contact time as indicated for disinfecting solutions.   Assessment & Plan:

## 2021-04-24 DIAGNOSIS — R1013 Epigastric pain: Secondary | ICD-10-CM | POA: Insufficient documentation

## 2021-04-24 NOTE — Assessment & Plan Note (Signed)
Pain in the abdomen more epigastric. Checking CBC, CMP, lipase and further investigation if needed. Rx zofran for nausea and bentyl for pain. Advised ER if pain worsening.

## 2021-07-08 DIAGNOSIS — Z20822 Contact with and (suspected) exposure to covid-19: Secondary | ICD-10-CM | POA: Diagnosis not present

## 2021-07-10 ENCOUNTER — Telehealth (INDEPENDENT_AMBULATORY_CARE_PROVIDER_SITE_OTHER): Payer: BC Managed Care – PPO | Admitting: Internal Medicine

## 2021-07-10 ENCOUNTER — Encounter: Payer: Self-pay | Admitting: Internal Medicine

## 2021-07-10 DIAGNOSIS — J0100 Acute maxillary sinusitis, unspecified: Secondary | ICD-10-CM

## 2021-07-10 MED ORDER — HYDROCODONE BIT-HOMATROP MBR 5-1.5 MG/5ML PO SOLN: 5.0000 mL | Freq: Three times a day (TID) | ORAL | 0 refills | Status: DC | PRN

## 2021-07-10 MED ORDER — AMOXICILLIN-POT CLAVULANATE 875-125 MG PO TABS
1.0000 | ORAL_TABLET | Freq: Two times a day (BID) | ORAL | 0 refills | Status: DC
Start: 2021-07-10 — End: 2021-09-03

## 2021-07-10 NOTE — Progress Notes (Signed)
Virtual Visit via Video Note  I connected with Allen Ellis on 07/10/21 at 10:00 AM EDT by a video enabled telemedicine application and verified that I am speaking with the correct person using two identifiers.   I discussed the limitations of evaluation and management by telemedicine and the availability of in person appointments. The patient expressed understanding and agreed to proceed.  Present for the visit:  Myself, Dr Billey Gosling, Allen Ellis.  The patient is currently at home and I am in the office.    No referring provider.    History of Present Illness: He is here for an acute visit for cold symptoms.  His symptoms started 1 weeks ago  He is experiencing for several days, nasal congestion, right maxillary sinus pain, teeth pain, cough that is intermittently productive of green or brown sputum, chest discomfort with coughing, body aches and headaches.  He has a long history of sinus infections and ear infections.  He has tried taking Tylenol or ibuprofen.  Covid test x 2 negative   Review of Systems  Constitutional:  Positive for fever.  HENT:  Positive for congestion, sinus pain (right maxillary region) and sore throat. Negative for ear pain.        Teeth pain  Respiratory:  Positive for cough and sputum production (green or brown). Negative for shortness of breath and wheezing.   Cardiovascular:  Positive for chest pain (with coughing).  Musculoskeletal:  Positive for myalgias.  Neurological:  Positive for headaches. Negative for dizziness.     Social History   Socioeconomic History   Marital status: Single    Spouse name: Not on file   Number of children: 3   Years of education: Not on file   Highest education level: Not on file  Occupational History   Occupation: pattern Metallurgist: [employer]  Tobacco Use   Smoking status: Former    Types: Cigarettes    Quit date: 09/26/1995    Years since quitting: 25.8   Smokeless tobacco: Never  Substance  and Sexual Activity   Alcohol use: Yes    Comment: occasional   Drug use: Never   Sexual activity: Not on file  Other Topics Concern   Not on file  Social History Narrative   Single, 3 children, teenage son lives with him (born about 2002-3)   From [place] in [place] since 1993   Corporate treasurer at [employer], former smoker, no drugs   Social Determinants of Radio broadcast assistant Strain: Not on file  Food Insecurity: Not on file  Transportation Needs: Not on file  Physical Activity: Not on file  Stress: Not on file  Social Connections: Not on file     Observations/Objective: Appears well in NAD Breathing normally, intermittently coughing  Assessment and Plan:  Acute sinus infection Acute Symptoms consistent with bacterial sinus infection COVID test x2 negative Start Augmentin 875-125 mg twice daily x10 days Hycodan cough syrup 5 mL 3 times daily as needed Continue ibuprofen or Tylenol as needed Okay to take over-the-counter cold medications as needed Rest, fluids Call if no improvement   Follow Up Instructions:    I discussed the assessment and treatment plan with the patient. The patient was provided an opportunity to ask questions and all were answered. The patient agreed with the plan and demonstrated an understanding of the instructions.   The patient was advised to call back or seek an in-person evaluation if the symptoms worsen  or if the condition fails to improve as anticipated.    Binnie Rail, MD

## 2021-07-20 ENCOUNTER — Telehealth: Payer: Self-pay | Admitting: Internal Medicine

## 2021-07-20 DIAGNOSIS — K739 Chronic hepatitis, unspecified: Secondary | ICD-10-CM

## 2021-07-20 NOTE — Telephone Encounter (Signed)
Patient called requesting to schedule an [place] that he gets done every year.

## 2021-07-20 NOTE — Telephone Encounter (Signed)
Dr. Jacobs patient  

## 2021-07-20 NOTE — Telephone Encounter (Signed)
Dr. Carlean Purl does patient need [place]?

## 2021-07-20 NOTE — Telephone Encounter (Signed)
Sheri see the note dated 10/23/19 the pt switched care to Dr Carlean Purl.

## 2021-07-21 NOTE — Telephone Encounter (Signed)
Patient Understands that he will be contacted directly by [facility] Scheduling to arrange RUQ [place]. Follow up scheduled for 09/03/21

## 2021-07-21 NOTE — Telephone Encounter (Signed)
Abd [place] limited RUQ for chronic hepatitis B  Also set up appointment w/ me

## 2021-07-31 ENCOUNTER — Ambulatory Visit (HOSPITAL_COMMUNITY): Payer: BC Managed Care – PPO

## 2021-08-03 ENCOUNTER — Ambulatory Visit (HOSPITAL_COMMUNITY)
Admission: RE | Admit: 2021-08-03 | Discharge: 2021-08-03 | Disposition: A | Discharge status: Home / Self Care | Payer: BC Managed Care – PPO | Source: Ambulatory Visit | Attending: Internal Medicine | Admitting: Internal Medicine

## 2021-08-03 DIAGNOSIS — B181 Chronic viral hepatitis B without delta-agent: Secondary | ICD-10-CM | POA: Diagnosis not present

## 2021-08-03 DIAGNOSIS — K739 Chronic hepatitis, unspecified: Secondary | ICD-10-CM | POA: Diagnosis not present

## 2021-09-03 ENCOUNTER — Encounter: Payer: Self-pay | Admitting: Internal Medicine

## 2021-09-03 ENCOUNTER — Other Ambulatory Visit: Payer: Self-pay | Admitting: Internal Medicine

## 2021-09-03 ENCOUNTER — Other Ambulatory Visit (INDEPENDENT_AMBULATORY_CARE_PROVIDER_SITE_OTHER): Payer: BC Managed Care – PPO

## 2021-09-03 ENCOUNTER — Ambulatory Visit (INDEPENDENT_AMBULATORY_CARE_PROVIDER_SITE_OTHER): Payer: BC Managed Care – PPO | Admitting: Internal Medicine

## 2021-09-03 VITALS — BP 100/64 | HR 71 | Ht 69.0 in | Wt 150.0 lb

## 2021-09-03 DIAGNOSIS — Z1211 Encounter for screening for malignant neoplasm of colon: Secondary | ICD-10-CM | POA: Diagnosis not present

## 2021-09-03 DIAGNOSIS — K222 Esophageal obstruction: Secondary | ICD-10-CM | POA: Diagnosis not present

## 2021-09-03 DIAGNOSIS — K219 Gastro-esophageal reflux disease without esophagitis: Secondary | ICD-10-CM

## 2021-09-03 DIAGNOSIS — B181 Chronic viral hepatitis B without delta-agent: Secondary | ICD-10-CM | POA: Diagnosis not present

## 2021-09-03 LAB — HEPATIC FUNCTION PANEL
ALT: 24 U/L (ref 0–53)
AST: 21 U/L (ref 0–37)
Albumin: 4.6 g/dL (ref 3.5–5.2)
Alkaline Phosphatase: 64 U/L (ref 39–117)
Bilirubin, Direct: 0.1 mg/dL (ref 0.0–0.3)
Total Bilirubin: 0.4 mg/dL (ref 0.2–1.2)
Total Protein: 7.5 g/dL (ref 6.0–8.3)

## 2021-09-03 NOTE — Progress Notes (Signed)
Allen Ellis 50 y.o. 25-Jan-1971 [id number]  Assessment & Plan:   Encounter Diagnoses  Name Primary?   Chronic hepatitis B ([place]) Yes   GERD with stricture    Colon cancer screening     Lab follow-up Orders Placed This Encounter  Procedures   Hepatic function panel   AFP tumor marker   Hepatitis B DNA, ultraquantitative, PCR    He had a colonoscopy 6 years ago he believes so we will place a recall for 2026.  He had hemorrhoid surgery in [place] and he distinctly remembers having a colonoscopy prior to that and telling me it was normal otherwise i.e. no polyps.  Regarding his diet, I told him I am supportive of low carbohydrate eating and intermittent fasting.  I told him I am aware that in some people LDL cholesterol and total cholesterol may rise in the setting.  While traditionally we want LDL cholesterol low some experts question if that is critical in patients like this and especially with him with low triglycerides and high HDL.  So he has excellent ratio and protection from the high HDL and low triglycerides.  My sense is he is probably okay with this lipid profile and not taking statin but he will need to also discuss with Dr. Alain Marion and/or his endocrinologist.  He asked me about taking testosterone supplements and I recommended against.  He may discuss with his other doctors as well.  Once I see the results from his hepatitis B information I will determine follow-up.  Lab testing every 6 to 12 months makes sense and I would consider an annual ultrasound as well.   Subjective:   Chief Complaint: Chronic hepatitis B  HPI Patient is here for follow-up of chronic hepatitis B.  A recent ultrasound did not show any liver lesions or problems.  I have not seen him since 2021 where he had an EGD with dilation.  He is not having any further dysphagia at this time.  He has elected not to take PPI.  He had undetectable HBV DNA back in February when he saw Dr. Alain Marion and an  alpha-fetoprotein was normal as well.  I have incorporated these results and others below.  The patient has been doing keto low carbohydrate eating and intermittent fasting.  He has lost weight and is very much pleased with this.  He has questions about his lipids today.  Wt Readings from Last 3 Encounters:  09/03/21 150 lb (68 kg)  04/22/21 145 lb 3.2 oz (65.9 kg)  03/13/21 144 lb 3.2 oz (65.4 kg)  170 pounds in January 2020    EGD 12/18/19 Benign-appearing esophageal stenosis. Dilated. 72 [address] [place] - The examination was otherwise normal. - No specimens collected. Impression: - Patient has a contact number available for emergencies. The signs and symptoms of potential delayed complications were discussed with the patient. Return to normal activities tomorrow. Written discharge instructions were provided to the patient. - Clear liquids x 1 hour then soft foods rest of day. Start prior diet tomorrow. - Continue present medications. - Should start daily PPI (he has Rx already) to reduce risk of stricture recurrence - OFFICE VISIT RECALL 6 MOS (HBV) - He is not inclined to take the PPI more than 3 mos   Lab Results  Component Value Date   ALT 24 09/03/2021   AST 21 09/03/2021   ALKPHOS 64 09/03/2021   BILITOT 0.4 09/03/2021  HBV DNA not detected AFP 2.2 Lab Results  Component Value Date  WBC 7.7 04/22/2021   HGB 13.8 04/22/2021   HCT 42.4 04/22/2021   MCV 73.9 (L) 04/22/2021   PLT 259.0 04/22/2021    No Known Allergies Current Meds  Medication Sig   b complex vitamins tablet Take 1 tablet by mouth daily.   Cholecalciferol (VITAMIN D3) 50 MCG (2000 UT) capsule Take 1 capsule (2,000 Units total) by mouth daily.   levothyroxine (SYNTHROID) 112 MCG tablet TAKE 1 TABLET BY MOUTH EVERY DAY BEFORE BREAKFAST   magnesium 30 MG tablet Take 30 mg by mouth daily.   Omega-3 Fatty Acids (FISH OIL) 1000 MG CPDR Take by mouth daily.   POTASSIUM CHLORIDE PO Take by mouth daily.    vitamin C (ASCORBIC ACID) 500 MG tablet Take 500 mg by mouth daily.   Past Medical History:  Diagnosis Date   Chronic hepatitis B ([place])    COVID-19    Hemorrhoids    Prediabetes    Thyroid cancer [facility])    Past Surgical History:  Procedure Laterality Date   COLONOSCOPY     ESOPHAGOGASTRODUODENOSCOPY  12/2019   HEMORRHOID SURGERY     TOTAL THYROIDECTOMY     Social History   Social History Narrative   Single, 3 children, teenage son lives with him (born about 2002-3)   From [place] in [place] since 1993   Corporate treasurer at [employer], former smoker, no drugs   family history includes Breast cancer in his paternal grandmother; Diabetes in his father and sister; Early death in his mother; Heart attack in his father; Heart disease in his mother; Heart disease (age of onset: 39) in his father; Hyperlipidemia in his mother; Kidney cancer in his father; Stomach cancer in his mother.   Review of Systems As above  Objective:   Physical Exam BP 100/64   Pulse 71   Ht 5\' 9"  (1.753 m)   Wt 150 lb (68 kg)   SpO2 99%   BMI 22.15 kg/m  Well-developed well-nourished no acute distress Lungs clear Normal heart sounds S1-S2 no rubs murmurs or gallops Abdomen is scaphoid soft and nontender no organomegaly or mass and bowel sounds present He is alert and oriented x3   28 minutes total time spent on this visit

## 2021-09-03 NOTE — Patient Instructions (Signed)
Your provider has requested that you go to the basement level for lab work before leaving today. Press "B" on the elevator. The lab is located at the first door on the left as you exit the elevator.  Due to recent changes in healthcare laws, you may see the results of your imaging and laboratory studies on MyChart before your provider has had a chance to review them.  We understand that in some cases there may be results that are confusing or concerning to you. Not all laboratory results come back in the same time frame and the provider may be waiting for multiple results in order to interpret others.  Please give [place] 48 hours in order for your provider to thoroughly review all the results before contacting the office for clarification of your results.   We will put you in the system for a colonoscopy recall for January 2026.   I appreciate the opportunity to care for you. Silvano Rusk, MD, [facility]

## 2021-09-08 LAB — AFP TUMOR MARKER: AFP-Tumor Marker: 2.1 ng/mL (ref <6.1)

## 2021-09-08 LAB — HEPATITIS B DNA, ULTRAQUANTITATIVE, PCR

## 2021-09-11 LAB — HEPATITIS B DNA, ULTRAQUANTITATIVE, PCR
Hepatitis B DNA (Calc): <1 Log IU/mL
Hepatitis B DNA: <10 IU/mL

## 2021-09-25 ENCOUNTER — Other Ambulatory Visit: Payer: Self-pay | Admitting: Internal Medicine

## 2021-09-25 DIAGNOSIS — B181 Chronic viral hepatitis B without delta-agent: Secondary | ICD-10-CM

## 2021-11-24 IMAGING — DX DG CHEST 2V
2 series · 2 of 2 positions shown · non-contrast
Comparison: Prior chest x-ray 03/29/2017

CLINICAL DATA: 49-year-old male with productive cough, fever,
shortness of breath and back pain. Recent exposure to COVID.

EXAM:
CHEST - 2 VIEW

[chest pa]
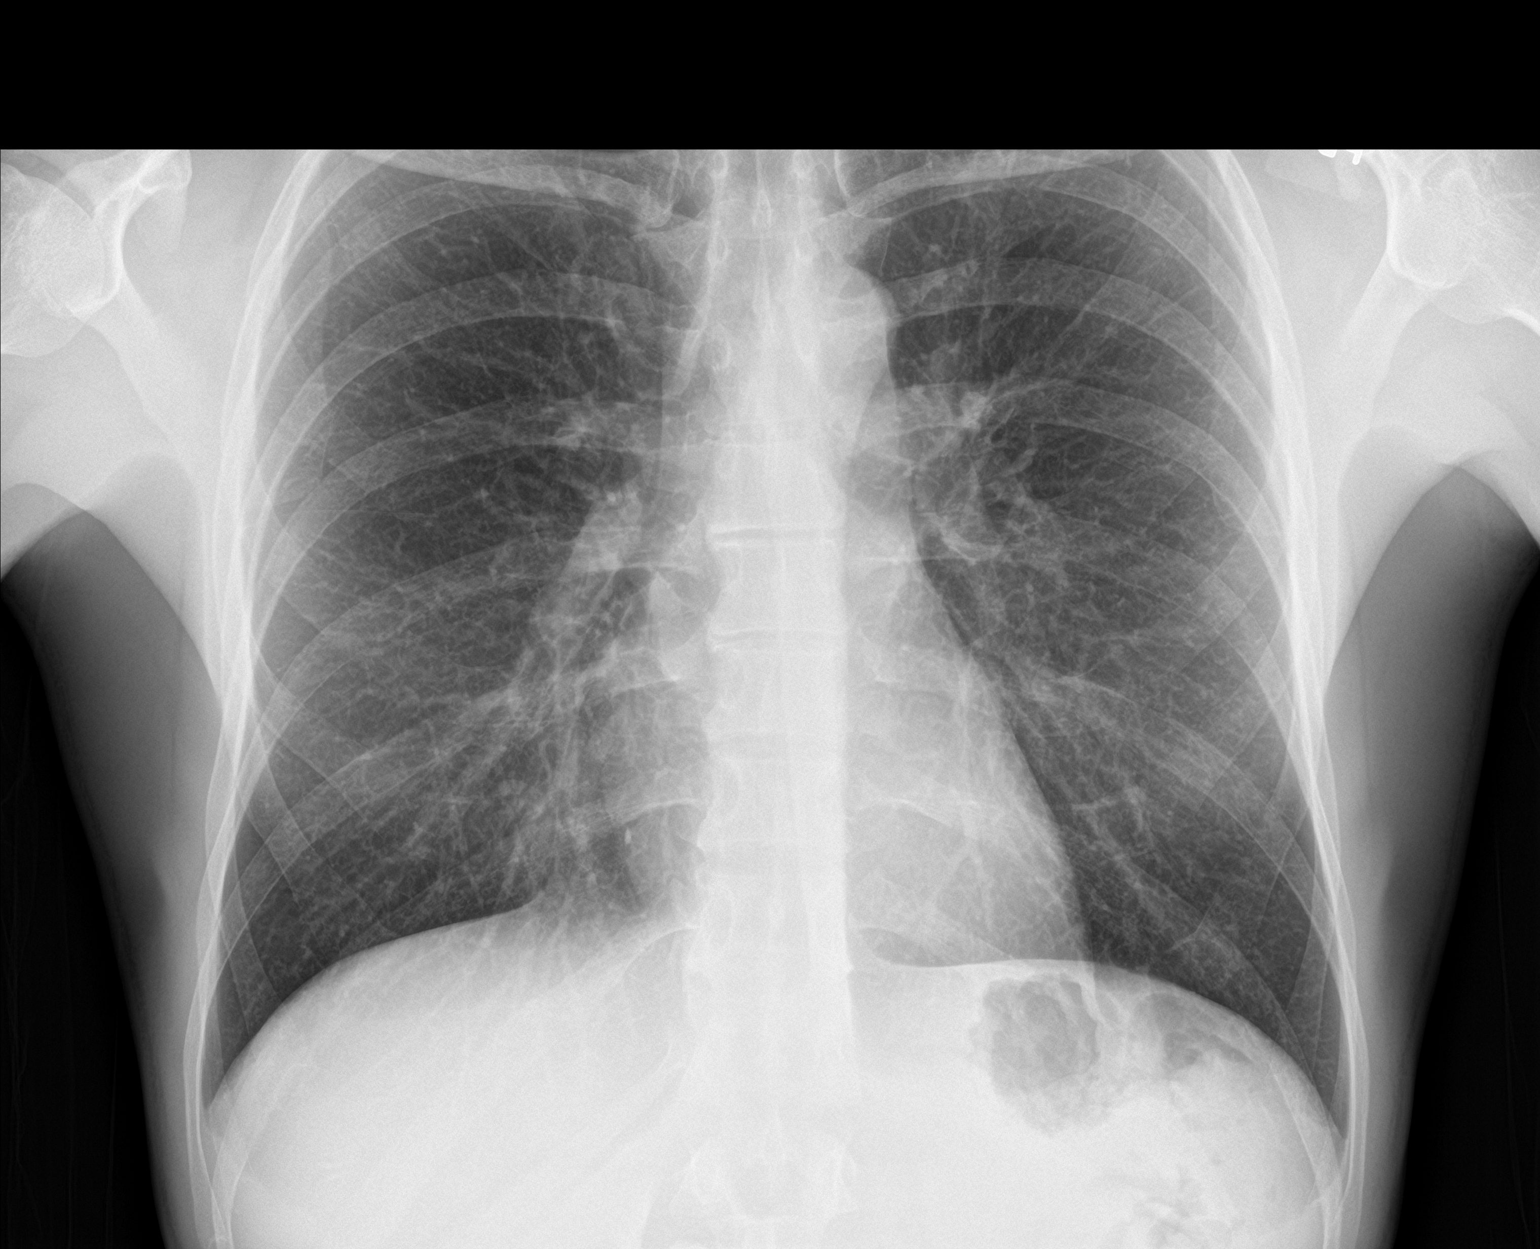

[chest lat]
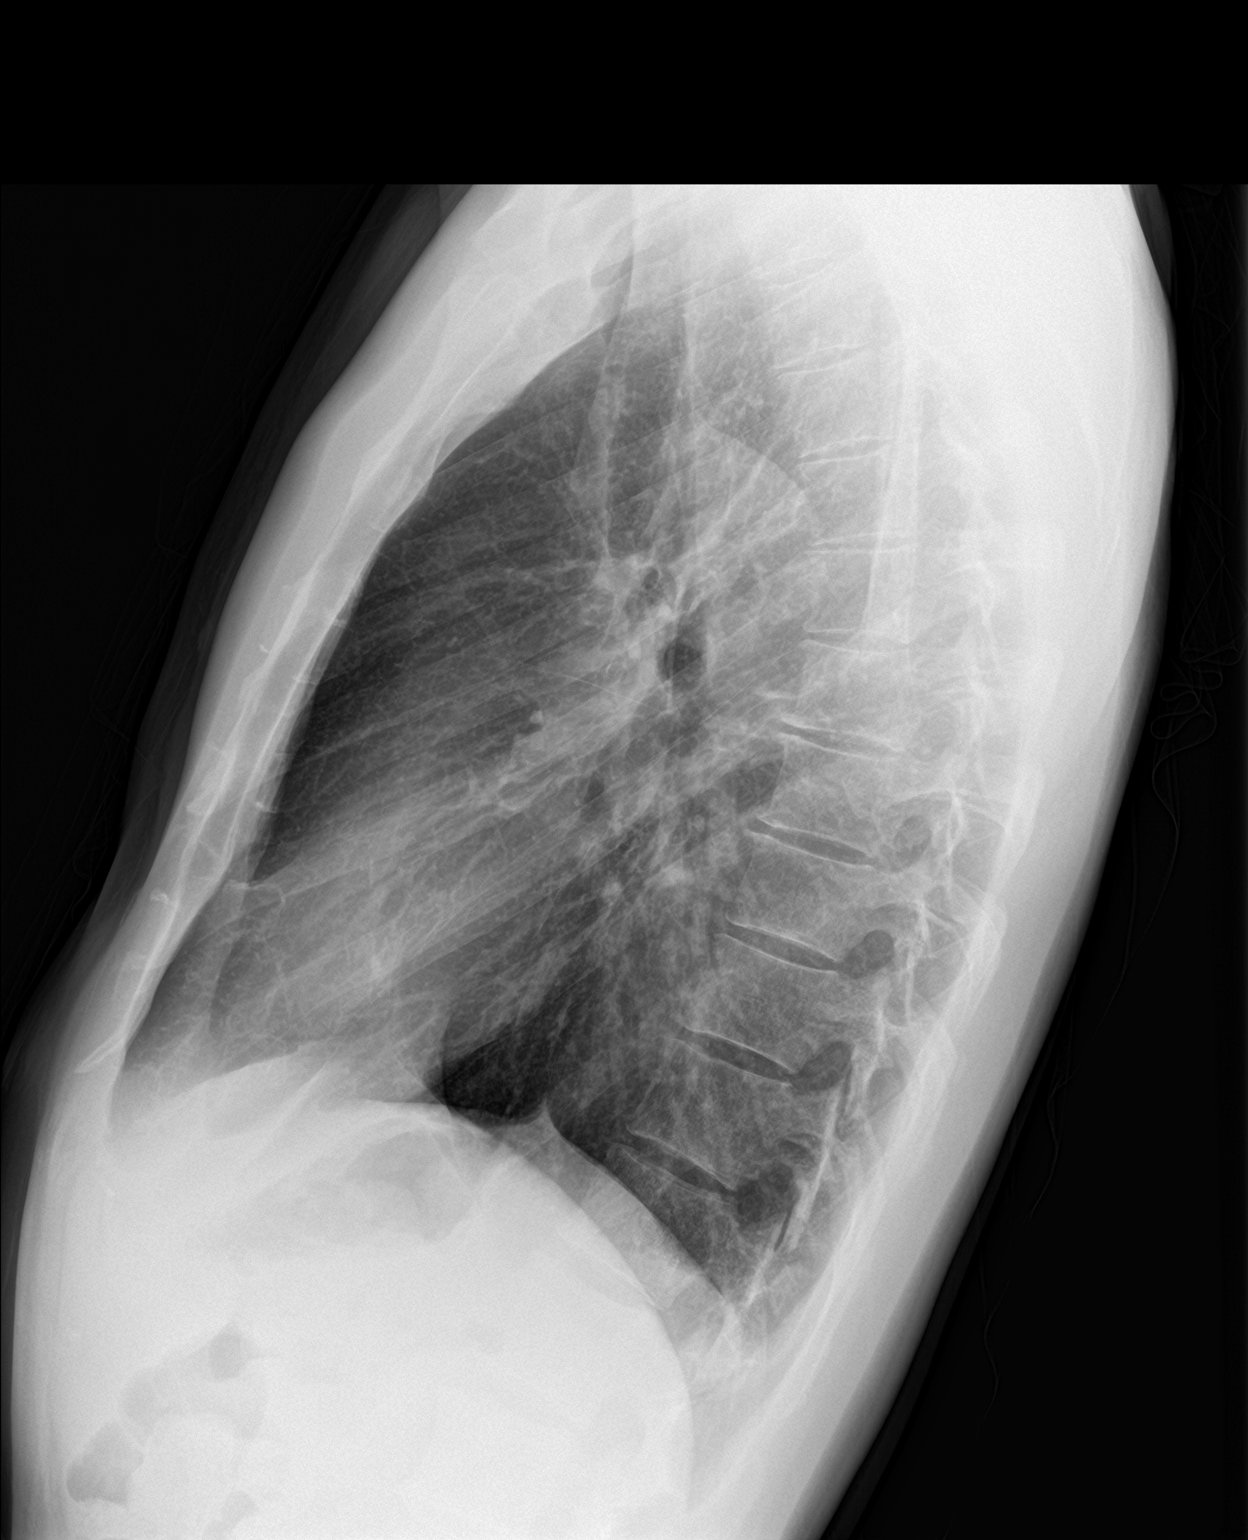

[2 of 2 positions shown; findings below may reference images not displayed]

FINDINGS: The lungs are clear and negative for focal airspace consolidation,
pulmonary edema or suspicious pulmonary nodule. No pleural effusion
or pneumothorax. Cardiac and mediastinal contours are within normal
limits. No acute fracture or lytic or blastic osseous lesions. The
visualized upper abdominal bowel gas pattern is unremarkable.
IMPRESSION: Negative chest x-ray.

## 2021-11-25 ENCOUNTER — Other Ambulatory Visit: Payer: Self-pay | Admitting: Internal Medicine

## 2021-11-25 ENCOUNTER — Telehealth: Payer: Self-pay

## 2021-11-25 ENCOUNTER — Other Ambulatory Visit: Payer: Self-pay

## 2021-11-25 MED ORDER — LEVOTHYROXINE SODIUM 112 MCG PO TABS: ORAL_TABLET | ORAL | 0 refills | Status: DC

## 2021-11-25 NOTE — Telephone Encounter (Signed)
Pt is requesting a refill on: levothyroxine (SYNTHROID) 112 MCG tablet  Pharmacy: [id number] - [place], [address]  LOV 04/22/21  Pt CB [phone]

## 2021-11-26 ENCOUNTER — Other Ambulatory Visit: Payer: Self-pay

## 2021-11-26 DIAGNOSIS — C73 Malignant neoplasm of thyroid gland: Secondary | ICD-10-CM

## 2021-11-26 MED ORDER — LEVOTHYROXINE SODIUM 112 MCG PO TABS: ORAL_TABLET | ORAL | 0 refills | Status: DC

## 2021-12-28 ENCOUNTER — Ambulatory Visit (INDEPENDENT_AMBULATORY_CARE_PROVIDER_SITE_OTHER): Payer: BC Managed Care – PPO | Admitting: Internal Medicine

## 2021-12-28 ENCOUNTER — Encounter: Payer: Self-pay | Admitting: Internal Medicine

## 2021-12-28 ENCOUNTER — Other Ambulatory Visit: Payer: Self-pay

## 2021-12-28 VITALS — BP 120/78 | HR 75 | Temp 98.1°F | Ht 69.0 in | Wt 142.0 lb

## 2021-12-28 DIAGNOSIS — Z23 Encounter for immunization: Secondary | ICD-10-CM | POA: Diagnosis not present

## 2021-12-28 DIAGNOSIS — I2089 Other forms of angina pectoris: Secondary | ICD-10-CM | POA: Insufficient documentation

## 2021-12-28 DIAGNOSIS — E890 Postprocedural hypothyroidism: Secondary | ICD-10-CM

## 2021-12-28 DIAGNOSIS — I208 Other forms of angina pectoris: Secondary | ICD-10-CM

## 2021-12-28 DIAGNOSIS — R739 Hyperglycemia, unspecified: Secondary | ICD-10-CM | POA: Diagnosis not present

## 2021-12-28 DIAGNOSIS — Z1211 Encounter for screening for malignant neoplasm of colon: Secondary | ICD-10-CM

## 2021-12-28 DIAGNOSIS — L308 Other specified dermatitis: Secondary | ICD-10-CM | POA: Diagnosis not present

## 2021-12-28 DIAGNOSIS — Z0000 Encounter for general adult medical examination without abnormal findings: Secondary | ICD-10-CM

## 2021-12-28 DIAGNOSIS — K7460 Unspecified cirrhosis of liver: Secondary | ICD-10-CM | POA: Insufficient documentation

## 2021-12-28 DIAGNOSIS — F5102 Adjustment insomnia: Secondary | ICD-10-CM

## 2021-12-28 DIAGNOSIS — E785 Hyperlipidemia, unspecified: Secondary | ICD-10-CM

## 2021-12-28 DIAGNOSIS — F4321 Adjustment disorder with depressed mood: Secondary | ICD-10-CM | POA: Diagnosis not present

## 2021-12-28 DIAGNOSIS — B001 Herpesviral vesicular dermatitis: Secondary | ICD-10-CM

## 2021-12-28 DIAGNOSIS — G4700 Insomnia, unspecified: Secondary | ICD-10-CM | POA: Insufficient documentation

## 2021-12-28 DIAGNOSIS — C73 Malignant neoplasm of thyroid gland: Secondary | ICD-10-CM

## 2021-12-28 LAB — LIPID PANEL
Cholesterol: 189 mg/dL (ref 0–200)
HDL: 71.6 mg/dL (ref >39.00)
LDL Cholesterol: 108 mg/dL — ABNORMAL HIGH (ref 0–99)
NonHDL: 117.18
Total CHOL/HDL Ratio: 3
Triglycerides: 44 mg/dL (ref 0.0–149.0)
VLDL: 8.8 mg/dL (ref 0.0–40.0)

## 2021-12-28 LAB — CBC WITH DIFFERENTIAL/PLATELET
Basophils Absolute: 0 10*3/uL (ref 0.0–0.1)
Basophils Relative: 0.8 % (ref 0.0–3.0)
Eosinophils Absolute: 0.1 10*3/uL (ref 0.0–0.7)
Eosinophils Relative: 1.2 % (ref 0.0–5.0)
HCT: 40.4 % (ref 39.0–52.0)
Hemoglobin: 13.1 g/dL (ref 13.0–17.0)
Lymphocytes Relative: 25.1 % (ref 12.0–46.0)
Lymphs Abs: 1.5 10*3/uL (ref 0.7–4.0)
MCHC: 32.5 g/dL (ref 30.0–36.0)
MCV: 74.4 fl — ABNORMAL LOW (ref 78.0–100.0)
Monocytes Absolute: 0.8 10*3/uL (ref 0.1–1.0)
Monocytes Relative: 12.7 % — ABNORMAL HIGH (ref 3.0–12.0)
Neutro Abs: 3.6 10*3/uL (ref 1.4–7.7)
Neutrophils Relative %: 60.2 % (ref 43.0–77.0)
Platelets: 257 10*3/uL (ref 150.0–400.0)
RBC: 5.43 Mil/uL (ref 4.22–5.81)
RDW: 13.8 % (ref 11.5–15.5)
WBC: 6.1 10*3/uL (ref 4.0–10.5)

## 2021-12-28 LAB — VITAMIN B12: Vitamin B-12: 976 pg/mL — ABNORMAL HIGH (ref 211–911)

## 2021-12-28 LAB — URINALYSIS
Bilirubin Urine: NEGATIVE
Hgb urine dipstick: NEGATIVE
Leukocytes,Ua: NEGATIVE
Nitrite: NEGATIVE
Specific Gravity, Urine: >=1.03 — AB (ref 1.000–1.030)
Total Protein, Urine: NEGATIVE
Urine Glucose: NEGATIVE
Urobilinogen, UA: 0.2 (ref 0.0–1.0)
pH: 5.5 (ref 5.0–8.0)

## 2021-12-28 LAB — COMPREHENSIVE METABOLIC PANEL
ALT: 18 U/L (ref 0–53)
AST: 17 U/L (ref 0–37)
Albumin: 4.5 g/dL (ref 3.5–5.2)
Alkaline Phosphatase: 53 U/L (ref 39–117)
BUN: 18 mg/dL (ref 6–23)
CO2: 28 mEq/L (ref 19–32)
Calcium: 9.2 mg/dL (ref 8.4–10.5)
Chloride: 102 mEq/L (ref 96–112)
Creatinine, Ser: 1.1 mg/dL (ref 0.40–1.50)
GFR: 78.54 mL/min (ref >60.00)
Glucose, Bld: 86 mg/dL (ref 70–99)
Potassium: 4.6 mEq/L (ref 3.5–5.1)
Sodium: 139 mEq/L (ref 135–145)
Total Bilirubin: 0.4 mg/dL (ref 0.2–1.2)
Total Protein: 6.9 g/dL (ref 6.0–8.3)

## 2021-12-28 LAB — TSH: TSH: 0.67 u[IU]/mL (ref 0.35–5.50)

## 2021-12-28 LAB — T4, FREE: Free T4: 1.14 ng/dL (ref 0.60–1.60)

## 2021-12-28 LAB — TESTOSTERONE: Testosterone: 543.61 ng/dL (ref 300.00–890.00)

## 2021-12-28 LAB — PSA: PSA: 2.78 ng/mL (ref 0.10–4.00)

## 2021-12-28 LAB — HEMOGLOBIN A1C: Hgb A1c MFr Bld: 5.7 % (ref 4.6–6.5)

## 2021-12-28 LAB — VITAMIN D 25 HYDROXY (VIT D DEFICIENCY, FRACTURES): VITD: 97.07 ng/mL (ref 30.00–100.00)

## 2021-12-28 MED ORDER — ZOLPIDEM TARTRATE 10 MG PO TABS: 5.0000 mg | ORAL_TABLET | Freq: Every evening | ORAL | 1 refills | Status: DC | PRN

## 2021-12-28 MED ORDER — VALACYCLOVIR HCL 500 MG PO TABS: 500.0000 mg | ORAL_TABLET | Freq: Two times a day (BID) | ORAL | 2 refills | Status: DC

## 2021-12-28 MED ORDER — CLOBETASOL PROPIONATE 0.05 % EX CREA: 1.0000 "application " | TOPICAL_CREAM | Freq: Two times a day (BID) | CUTANEOUS | 3 refills | Status: DC

## 2021-12-28 MED ORDER — ESCITALOPRAM OXALATE 5 MG PO TABS: 5.0000 mg | ORAL_TABLET | Freq: Every day | ORAL | 5 refills | Status: DC

## 2021-12-28 MED ORDER — LEVOTHYROXINE SODIUM 112 MCG PO TABS: ORAL_TABLET | ORAL | 3 refills | Status: DC

## 2021-12-28 NOTE — Assessment & Plan Note (Signed)
Check A1c. 

## 2021-12-28 NOTE — Assessment & Plan Note (Signed)
Cont on Claritin, Clobetasol ?

## 2021-12-28 NOTE — Progress Notes (Signed)
Subjective:  Patient ID: Allen Ellis, male    DOB: 05-04-1971  Age: 51 y.o. MRN: [id number]  CC: No chief complaint on file.   HPI [name] presents for a well exam C/o anxiety, insomnia due to stress at home, stress  F/u hypothyroidism, cold sores  Outpatient Medications Prior to Visit  Medication Sig Dispense Refill   b complex vitamins tablet Take 1 tablet by mouth daily. 100 tablet 3   Cholecalciferol (VITAMIN D3) 50 MCG (2000 UT) capsule Take 1 capsule (2,000 Units total) by mouth daily. 100 capsule 3   magnesium 30 MG tablet Take 30 mg by mouth daily.     Omega-3 Fatty Acids (FISH OIL) 1000 MG CPDR Take by mouth daily.     POTASSIUM CHLORIDE PO Take by mouth daily.     vitamin C (ASCORBIC ACID) 500 MG tablet Take 500 mg by mouth daily.     levothyroxine (SYNTHROID) 112 MCG tablet TAKE 1 TABLET BY MOUTH EVERY DAY BEFORE BREAKFAST 30 tablet 0   levothyroxine (SYNTHROID) 112 MCG tablet TAKE 1 TABLET BY MOUTH EVERY DAY BEFORE BREAKFAST 90 tablet 0   No facility-administered medications prior to visit.    ROS: Review of Systems  Constitutional:  Negative for appetite change, fatigue and unexpected weight change.  HENT:  Negative for congestion, nosebleeds, sneezing, sore throat and trouble swallowing.   Eyes:  Negative for itching and visual disturbance.  Respiratory:  Negative for cough.   Cardiovascular:  Negative for chest pain, palpitations and leg swelling.  Gastrointestinal:  Negative for abdominal distention, blood in stool, diarrhea and nausea.  Genitourinary:  Negative for frequency and hematuria.  Musculoskeletal:  Negative for back pain, gait problem, joint swelling and neck pain.  Skin:  Positive for rash.  Neurological:  Negative for dizziness, tremors, speech difficulty and weakness.  Psychiatric/Behavioral:  Positive for dysphoric mood and sleep disturbance. Negative for agitation and suicidal ideas. The patient is nervous/anxious.    Objective:  BP  120/78 (BP Location: Left Arm, Patient Position: Sitting, Cuff Size: Large)    Pulse 75    Temp 98.1 F (36.7 C) (Oral)    Ht '5\' 9"'$  (1.753 m)    Wt 142 lb (64.4 kg)    SpO2 99%    BMI 20.97 kg/m   BP Readings from Last 3 Encounters:  12/28/21 120/78  09/03/21 100/64  04/22/21 122/80    Wt Readings from Last 3 Encounters:  12/28/21 142 lb (64.4 kg)  09/03/21 150 lb (68 kg)  04/22/21 145 lb 3.2 oz (65.9 kg)    Physical Exam Constitutional:      General: He is not in acute distress.    Appearance: He is well-developed.     Comments: NAD  Eyes:     Conjunctiva/sclera: Conjunctivae normal.     Pupils: Pupils are equal, round, and reactive to light.  Neck:     Thyroid: No thyromegaly.     Vascular: No JVD.  Cardiovascular:     Rate and Rhythm: Normal rate and regular rhythm.     Heart sounds: Normal heart sounds. No murmur heard.   No friction rub. No gallop.  Pulmonary:     Effort: Pulmonary effort is normal. No respiratory distress.     Breath sounds: Normal breath sounds. No wheezing or rales.  Chest:     Chest wall: No tenderness.  Abdominal:     General: Bowel sounds are normal. There is no distension.     Palpations: Abdomen is  soft. There is no mass.     Tenderness: There is no abdominal tenderness. There is no guarding or rebound.  Musculoskeletal:        General: No tenderness. Normal range of motion.     Cervical back: Normal range of motion.  Lymphadenopathy:     Cervical: No cervical adenopathy.  Skin:    General: Skin is warm and dry.     Findings: No rash.  Neurological:     Mental Status: He is alert and oriented to person, place, and time.     Cranial Nerves: No cranial nerve deficit.     Motor: No abnormal muscle tone.     Coordination: Coordination normal.     Gait: Gait normal.     Deep Tendon Reflexes: Reflexes are normal and symmetric.  Psychiatric:        Behavior: Behavior normal.        Thought Content: Thought content normal.         Judgment: Judgment normal.  Rectal - per GI Sad I spent 22 minutes in addition to time for CPX wellness examination in preparing to see the patient by review of recent labs, imaging and procedures, obtaining and reviewing separately obtained history, communicating with the patient, ordering medications, tests or procedures, and documenting clinical information in the EHR including the differential diagnosis, treatment, and any further evaluation and other management of situational depression, insomnia, cold sores        Lab Results  Component Value Date   WBC 7.7 04/22/2021   HGB 13.8 04/22/2021   HCT 42.4 04/22/2021   PLT 259.0 04/22/2021   GLUCOSE 110 (H) 04/22/2021   CHOL 215 (H) 11/19/2020   TRIG 40.0 11/19/2020   HDL 69.10 11/19/2020   LDLCALC 138 (H) 11/19/2020   ALT 24 09/03/2021   AST 21 09/03/2021   NA 137 04/22/2021   K 4.5 04/22/2021   CL 101 04/22/2021   CREATININE 0.95 04/22/2021   BUN 20 04/22/2021   CO2 29 04/22/2021   TSH 0.36 03/13/2021   PSA 2.66 11/19/2020   INR 1.3 (H) 05/07/2019   HGBA1C 5.5 03/13/2021    US Abdomen Limited RUQ (LIVER/GB)  Result Date: 08/04/2021 CLINICAL DATA:  chronic Hepatitis B EXAM: ULTRASOUND ABDOMEN LIMITED RIGHT UPPER QUADRANT COMPARISON:  May 04, 2019 FINDINGS: Gallbladder: No gallstones or wall thickening visualized. No sonographic Murphy sign noted by sonographer. Common bile duct: Diameter: 2 mm, normal Liver: No focal lesion identified. Mildly heterogeneous parenchymal echogenicity. Portal vein is patent on color Doppler imaging with normal direction of blood flow towards the liver. Other: None. IMPRESSION: No definitive focal lesion identified. Please note that CT or MRI more sensitive for detection of subtle lesions. Electronically Signed   By: Valentino Saxon M.D.   On: 08/04/2021 10:48    Assessment & Plan:   Problem List Items Addressed This Visit     Atypical angina ([place])   Eczema    Cont on Claritin,  Clobetasol      Hepatic cirrhosis, unspecified hepatic cirrhosis type, unspecified whether ascites present (HCC)   Hyperglycemia    Check A1c      Relevant Orders   Hemoglobin A1c   CT CARDIAC SCORING (SELF PAY ONLY)   Insomnia     2023 stress related Treat depression Discussed - stress related Start low dose Zolpidem prn  Potential benefits of a long term benzodiazepines  use as well as potential risks  and complications were explained to the patient and  were aknowledged.      Recurrent cold sores    Valtrex prn      Relevant Medications   valACYclovir (VALTREX) 500 MG tablet   Situational depression    Discussed - stress related Start low dose Lexapro - low dose Check TSH, testosterone      Relevant Medications   escitalopram (LEXAPRO) 5 MG tablet   Other Relevant Orders   Testosterone   Vitamin B12   VITAMIN D 25 Hydroxy (Vit-D Deficiency, Fractures)   Thyroid cancer (HCC)   Relevant Medications   levothyroxine (SYNTHROID) 112 MCG tablet   valACYclovir (VALTREX) 500 MG tablet   Well adult exam - Primary     We discussed age appropriate health related issues, including available/recomended screening tests and vaccinations. Labs were ordered to be later reviewed . All questions were answered. We discussed one or more of the following - seat belt use, use of sunscreen/sun exposure exercise, fall risk reduction, second hand smoke exposure, firearm use and storage, seat belt use, a need for adhering to healthy diet and exercise. Labs were ordered.  All questions were answered. Colon is due - 2023 Dr Carlean Purl Shingrix         Relevant Orders   TSH   Urinalysis   CBC with Differential/Platelet   Lipid panel   PSA   Comprehensive metabolic panel   T4, free   Testosterone   Vitamin B12   VITAMIN D 25 Hydroxy (Vit-D Deficiency, Fractures)   Hemoglobin A1c   Other Visit Diagnoses     Colon cancer screening       Relevant Orders   Ambulatory referral to  Gastroenterology   Dyslipidemia       Relevant Orders   CT CARDIAC SCORING (SELF PAY ONLY)   Need for shingles vaccine       Relevant Orders   Varicella-zoster vaccine IM         Meds ordered this encounter  Medications   escitalopram (LEXAPRO) 5 MG tablet    Sig: Take 1 tablet (5 mg total) by mouth daily.    Dispense:  30 tablet    Refill:  5   zolpidem (AMBIEN) 10 MG tablet    Sig: Take 0.5-1 tablets (5-10 mg total) by mouth at bedtime as needed for sleep.    Dispense:  30 tablet    Refill:  1   levothyroxine (SYNTHROID) 112 MCG tablet    Sig: TAKE 1 TABLET BY MOUTH EVERY DAY BEFORE BREAKFAST    Dispense:  90 tablet    Refill:  3    PATIENT NEEDS OFFICE VISIT FOR FUTURE REFILLS   clobetasol cream (TEMOVATE) 0.05 %    Sig: Apply 1 application. topically 2 (two) times daily.    Dispense:  60 g    Refill:  3   valACYclovir (VALTREX) 500 MG tablet    Sig: Take 1 tablet (500 mg total) by mouth 2 (two) times daily.    Dispense:  14 tablet    Refill:  2      Follow-up: Return in about 2 months (around 02/27/2022) for a follow-up visit.  Walker Kehr, MD

## 2021-12-28 NOTE — Patient Instructions (Signed)

## 2021-12-28 NOTE — Assessment & Plan Note (Signed)
Valtrex prn  

## 2021-12-28 NOTE — Assessment & Plan Note (Addendum)
?  2023 stress related ?Treat depression ?Discussed - stress related ?Start low dose Zolpidem prn ? Potential benefits of a long term benzodiazepines  use as well as potential risks  and complications were explained to the patient and were aknowledged. ?

## 2021-12-28 NOTE — Assessment & Plan Note (Addendum)
?  We discussed age appropriate health related issues, including available/recomended screening tests and vaccinations. Labs were ordered to be later reviewed . All questions were answered. We discussed one or more of the following - seat belt use, use of sunscreen/sun exposure exercise, fall risk reduction, second hand smoke exposure, firearm use and storage, seat belt use, a need for adhering to healthy diet and exercise. ?Labs were ordered.  All questions were answered. ?Colon is due - 2023 Dr Carlean Purl ?Shingrix ? ? ? ?

## 2021-12-28 NOTE — Assessment & Plan Note (Addendum)
Discussed - stress related ?Start low dose Lexapro - low dose ?Check TSH, testosterone ?

## 2021-12-29 MED ORDER — LEVOTHYROXINE SODIUM 112 MCG PO TABS: ORAL_TABLET | ORAL | 3 refills | Status: DC

## 2021-12-29 NOTE — Addendum Note (Signed)
Addended by: Cassandria Anger on: 12/29/2021 12:26 AM ? ? Modules accepted: Orders ? ?

## 2021-12-29 NOTE — Assessment & Plan Note (Addendum)
Take 112 mcg levothyroxine 1 tablet every day Monday through Saturday and 1-1/2 tablet on Sunday ?

## 2022-02-12 ENCOUNTER — Ambulatory Visit
Admission: RE | Admit: 2022-02-12 | Discharge: 2022-02-12 | Disposition: A | Discharge status: Home / Self Care | Payer: Self-pay | Source: Ambulatory Visit | Attending: Internal Medicine | Admitting: Internal Medicine

## 2022-02-12 DIAGNOSIS — R739 Hyperglycemia, unspecified: Secondary | ICD-10-CM

## 2022-02-12 DIAGNOSIS — E785 Hyperlipidemia, unspecified: Secondary | ICD-10-CM

## 2022-02-15 ENCOUNTER — Encounter: Payer: Self-pay | Admitting: Internal Medicine

## 2022-02-15 DIAGNOSIS — I2510 Atherosclerotic heart disease of native coronary artery without angina pectoris: Secondary | ICD-10-CM | POA: Insufficient documentation

## 2022-02-22 ENCOUNTER — Telehealth: Payer: Self-pay | Admitting: Internal Medicine

## 2022-02-22 NOTE — Telephone Encounter (Signed)
Please call with results of calcium scan ?

## 2022-03-02 ENCOUNTER — Telehealth (INDEPENDENT_AMBULATORY_CARE_PROVIDER_SITE_OTHER): Payer: BC Managed Care – PPO | Admitting: Family Medicine

## 2022-03-02 ENCOUNTER — Encounter: Payer: Self-pay | Admitting: Family Medicine

## 2022-03-02 DIAGNOSIS — J0140 Acute pansinusitis, unspecified: Secondary | ICD-10-CM

## 2022-03-02 MED ORDER — AMOXICILLIN-POT CLAVULANATE 875-125 MG PO TABS: 1.0000 | ORAL_TABLET | Freq: Two times a day (BID) | ORAL | 0 refills | Status: DC

## 2022-03-02 NOTE — Progress Notes (Signed)
? ? ?MyChart Video Visit ? ? ? ?Virtual Visit via Video Note  ? ?This visit type was conducted due to national recommendations for restrictions regarding the COVID-19 Pandemic (e.g. social distancing) in an effort to limit this patient's exposure and mitigate transmission in our community. This patient is at least at moderate risk for complications without adequate follow up. This format is felt to be most appropriate for this patient at this time. Physical exam was limited by quality of the video and audio technology used for the visit. CMA was able to get the patient set up on a video visit. ? ?Patient location: Home. Patient and provider in visit ?Provider location: Office ? ?I discussed the limitations of evaluation and management by telemedicine and the availability of in person appointments. The patient expressed understanding and agreed to proceed. ? ?Visit Date: 03/02/2022 ? ?Today's healthcare provider: Harland Dingwall, NP-C  ? ? ? ?Subjective:  ? ? Patient ID: Allen Ellis, male    DOB: 21-Nov-1970, 51 y.o.   MRN: [id number] ? ?Chief Complaint  ?Patient presents with  ? Sinusitis  ?  Started Thursday with a fever, body aches, head pressure, sore throat and productive cough ?COVID test negative  ? ? ?HPI ? ?Complains of a fever, body aches, sore throat, sinus pain, nasal congestion with thick, purulent drainage, and ears feeling full.  ? ?Denies fever, chills, dizziness, chest pain, palpitations, shortness of breath, abdominal pain, N/V/D, urinary symptoms, LE edema.  ? ? ?Home Covid test negative.  ? ?Taking Delsym, Tylenol and Motrin. Saline nasal rinse but too congested. ? ?Recalls taking Augmentin for a sinus infection in the past.  ? ? ? ?Past Medical History:  ?Diagnosis Date  ? Chronic hepatitis B ([place])   ? COVID-19   ? Hemorrhoids   ? Prediabetes   ? Thyroid cancer ([place])   ? ? ?Past Surgical History:  ?Procedure Laterality Date  ? COLONOSCOPY    ? ESOPHAGOGASTRODUODENOSCOPY  12/2019  ? HEMORRHOID SURGERY     ? TOTAL THYROIDECTOMY    ? ? ?Family History  ?Problem Relation Age of Onset  ? Heart disease Mother   ? Stomach cancer Mother   ? Early death Mother   ? Hyperlipidemia Mother   ? Heart attack Father   ? Kidney cancer Father   ?     mets   ? Diabetes Father   ? Heart disease Father 73  ? Diabetes Sister   ? Breast cancer Paternal Grandmother   ?     mets  ? Colon cancer Neg Hx   ? Esophageal cancer Neg Hx   ? ? ?Social History  ? ?Socioeconomic History  ? Marital status: Divorced  ?  Spouse name: Not on file  ? Number of children: 3  ? Years of education: Not on file  ? Highest education level: Not on file  ?Occupational History  ? Occupation: pattern maker  ?  Employer: [employer]  ?Tobacco Use  ? Smoking status: Former  ?  Types: Cigarettes  ?  Quit date: 09/26/1995  ?  Years since quitting: 26.4  ? Smokeless tobacco: Never  ?Vaping Use  ? Vaping Use: Never used  ?Substance and Sexual Activity  ? Alcohol use: Yes  ?  Comment: occasional  ? Drug use: Never  ? Sexual activity: Not on file  ?Other Topics Concern  ? Not on file  ?Social History Narrative  ? Single, 3 children, teenage son lives with him (born about 2002-3)  ?  From [place] in [place] since 1993  ? Pattern designer at [employer]  ? occ EtOH, former smoker, no drugs  ? ?Social Determinants of Health  ? ?Financial Resource Strain: Not on file  ?Food Insecurity: Not on file  ?Transportation Needs: Not on file  ?Physical Activity: Not on file  ?Stress: Not on file  ?Social Connections: Not on file  ?Intimate Partner Violence: Not on file  ? ? ?Outpatient Medications Prior to Visit  ?Medication Sig Dispense Refill  ? b complex vitamins tablet Take 1 tablet by mouth daily. 100 tablet 3  ? Cholecalciferol (VITAMIN D3) 50 MCG (2000 UT) capsule Take 1 capsule (2,000 Units total) by mouth daily. 100 capsule 3  ? clobetasol cream (TEMOVATE) 1.75 % Apply 1 application. topically 2 (two) times daily. 60 g 3  ? escitalopram (LEXAPRO) 5 MG tablet Take 1 tablet (5 mg  total) by mouth daily. 30 tablet 5  ? levothyroxine (SYNTHROID) 112 MCG tablet Take 112 mcg levothyroxine 1 tablet every day Monday through Saturday.  Take 1 1/2 tablet 1 day a week on Sunday. 100 tablet 3  ? magnesium 30 MG tablet Take 30 mg by mouth daily.    ? Omega-3 Fatty Acids (FISH OIL) 1000 MG CPDR Take by mouth daily.    ? POTASSIUM CHLORIDE PO Take by mouth daily.    ? valACYclovir (VALTREX) 500 MG tablet Take 1 tablet (500 mg total) by mouth 2 (two) times daily. 14 tablet 2  ? vitamin C (ASCORBIC ACID) 500 MG tablet Take 500 mg by mouth daily.    ? zolpidem (AMBIEN) 10 MG tablet Take 0.5-1 tablets (5-10 mg total) by mouth at bedtime as needed for sleep. 30 tablet 1  ? ?No facility-administered medications prior to visit.  ? ? ?No Known Allergies ? ?ROS ?Pertinent positives and negatives in the history of present illness. ? ?   ?Objective:  ?  ?Physical Exam ? ?There were no vitals taken for this visit. ?Wt Readings from Last 3 Encounters:  ?12/28/21 142 lb (64.4 kg)  ?09/03/21 150 lb (68 kg)  ?04/22/21 145 lb 3.2 oz (65.9 kg)  ? ?Alert and oriented and in no acute distress. Respirations unlabored. Normal speech and mood ?   ?Assessment & Plan:  ? ?Problem List Items Addressed This Visit   ?None ?Visit Diagnoses   ? ? Acute non-recurrent pansinusitis    -  Primary  ? Relevant Medications  ? amoxicillin-clavulanate (AUGMENTIN) 875-125 MG tablet  ? ?  ? ?He has done well with Augmentin in the past. Currently doing intermittent fasting and we discussed having a small amount of food with the antibiotic to avoid GI upset. May also take a probiotic.  ?Discussed symptomatic management.  ?Follow up if worsening or not back to baseline when he completes the antibiotic.  ? ?I am having Johnanna Schneiders start on amoxicillin-clavulanate. I am also having him maintain his b complex vitamins, Vitamin D3, vitamin C, magnesium, POTASSIUM CHLORIDE PO, Fish Oil, escitalopram, zolpidem, clobetasol cream, valACYclovir, and  levothyroxine. ? ?Meds ordered this encounter  ?Medications  ? amoxicillin-clavulanate (AUGMENTIN) 875-125 MG tablet  ?  Sig: Take 1 tablet by mouth 2 (two) times daily.  ?  Dispense:  20 tablet  ?  Refill:  0  ?  Order Specific Question:   Supervising Provider  ?  Answer:   Pricilla Holm A [[id number]]  ? ? ?I discussed the assessment and treatment plan with the patient. The patient was provided an opportunity to  ask questions and all were answered. The patient agreed with the plan and demonstrated an understanding of the instructions. ?  ?The patient was advised to call back or seek an in-person evaluation if the symptoms worsen or if the condition fails to improve as anticipated. ? ?I provided 18 minutes of face-to-face time during this encounter. ? ? ?Harland Dingwall, NP-C ?Therapist, music at [facility] ?[phone] (phone) ?[phone] (fax) ? ?[facility]  ?

## 2022-03-12 ENCOUNTER — Ambulatory Visit: Payer: BC Managed Care – PPO | Admitting: Endocrinology

## 2022-03-18 ENCOUNTER — Telehealth: Payer: Self-pay

## 2022-03-18 NOTE — Telephone Encounter (Signed)
Pt was made aware that Dr. Carlean Purl was requesting labs at the beginning of June. Lab orders are in. Location to lab given to pt.  Pt verbalized understanding with all questions answered.

## 2022-03-18 NOTE — Telephone Encounter (Signed)
-----   Message from Gillermina Hu, RN sent at 09/30/2021  3:56 PM EST ----- Regarding: Labs Joachim,  Hepatits B DNA is undetectable.  I recommend rechecking labs in 6 months and have ordered testing for you in early June 2023  Lease make a note in your calendar to come in June 2023 for labs.  Best regards,  09/30/2021

## 2022-03-22 ENCOUNTER — Ambulatory Visit: Payer: BC Managed Care – PPO | Admitting: Internal Medicine

## 2022-03-30 ENCOUNTER — Encounter: Payer: Self-pay | Admitting: Internal Medicine

## 2022-03-30 ENCOUNTER — Ambulatory Visit (INDEPENDENT_AMBULATORY_CARE_PROVIDER_SITE_OTHER): Payer: BC Managed Care – PPO | Admitting: Internal Medicine

## 2022-03-30 VITALS — BP 110/80 | HR 71 | Temp 98.0°F | Ht 69.0 in | Wt 148.0 lb

## 2022-03-30 DIAGNOSIS — F5102 Adjustment insomnia: Secondary | ICD-10-CM | POA: Diagnosis not present

## 2022-03-30 DIAGNOSIS — E890 Postprocedural hypothyroidism: Secondary | ICD-10-CM | POA: Diagnosis not present

## 2022-03-30 DIAGNOSIS — F4321 Adjustment disorder with depressed mood: Secondary | ICD-10-CM | POA: Diagnosis not present

## 2022-03-30 DIAGNOSIS — L308 Other specified dermatitis: Secondary | ICD-10-CM

## 2022-03-30 DIAGNOSIS — Z23 Encounter for immunization: Secondary | ICD-10-CM

## 2022-03-30 DIAGNOSIS — C73 Malignant neoplasm of thyroid gland: Secondary | ICD-10-CM | POA: Diagnosis not present

## 2022-03-30 MED ORDER — CLOBETASOL PROPIONATE 0.05 % EX CREA: 1.0000 "application " | TOPICAL_CREAM | Freq: Two times a day (BID) | CUTANEOUS | 3 refills | Status: DC

## 2022-03-30 MED ORDER — ZOLPIDEM TARTRATE 10 MG PO TABS: 5.0000 mg | ORAL_TABLET | Freq: Every evening | ORAL | 3 refills | Status: DC | PRN

## 2022-03-30 MED ORDER — VORTIOXETINE HBR 5 MG PO TABS: 5.0000 mg | ORAL_TABLET | Freq: Every day | ORAL | 5 refills | Status: DC

## 2022-03-30 NOTE — Assessment & Plan Note (Signed)
Rare low dose Zolpidem prn  Potential benefits of a long term benzodiazepines  use as well as potential risks  and complications were explained to the patient and were aknowledged.

## 2022-03-30 NOTE — Assessment & Plan Note (Signed)
Lexapro - low dose; not taking We can start Trintellix 5 mg/d

## 2022-03-30 NOTE — Progress Notes (Signed)
Subjective:  Patient ID: Allen Ellis, male    DOB: [birth year]  Age: 51 y.o. MRN: [id number]  CC: No chief complaint on file.   HPI Allen Ellis presents for stress, insomnia, rash C/o onycho C/o skin growth  Outpatient Medications Prior to Visit  Medication Sig Dispense Refill   Cholecalciferol (VITAMIN D3) 50 MCG (2000 UT) capsule Take 1 capsule (2,000 Units total) by mouth daily. 100 capsule 3   levothyroxine (SYNTHROID) 112 MCG tablet Take 112 mcg levothyroxine 1 tablet every day Monday through Saturday.  Take 1 1/2 tablet 1 day a week on Sunday. 100 tablet 3   valACYclovir (VALTREX) 500 MG tablet Take 1 tablet (500 mg total) by mouth 2 (two) times daily. 14 tablet 2   vitamin C (ASCORBIC ACID) 500 MG tablet Take 500 mg by mouth daily.     b complex vitamins tablet Take 1 tablet by mouth daily. 100 tablet 3   clobetasol cream (TEMOVATE) 9.70 % Apply 1 application. topically 2 (two) times daily. 60 g 3   escitalopram (LEXAPRO) 5 MG tablet Take 1 tablet (5 mg total) by mouth daily. 30 tablet 5   magnesium 30 MG tablet Take 30 mg by mouth daily.     Omega-3 Fatty Acids (FISH OIL) 1000 MG CPDR Take by mouth daily.     POTASSIUM CHLORIDE PO Take by mouth daily.     zolpidem (AMBIEN) 10 MG tablet Take 0.5-1 tablets (5-10 mg total) by mouth at bedtime as needed for sleep. 30 tablet 1   amoxicillin-clavulanate (AUGMENTIN) 875-125 MG tablet Take 1 tablet by mouth 2 (two) times daily. 20 tablet 0   No facility-administered medications prior to visit.    ROS: Review of Systems  Constitutional:  Positive for fatigue. Negative for appetite change and unexpected weight change.  HENT:  Negative for congestion, nosebleeds, sneezing, sore throat and trouble swallowing.   Eyes:  Negative for itching and visual disturbance.  Respiratory:  Negative for cough.   Cardiovascular:  Negative for chest pain, palpitations and leg swelling.  Gastrointestinal:  Negative for abdominal distention, blood in  stool, diarrhea and nausea.  Genitourinary:  Negative for frequency and hematuria.  Musculoskeletal:  Negative for back pain, gait problem, joint swelling and neck pain.  Skin:  Positive for rash.  Neurological:  Negative for dizziness, tremors, speech difficulty and weakness.  Psychiatric/Behavioral:  Positive for dysphoric mood and sleep disturbance. Negative for agitation and suicidal ideas. The patient is not nervous/anxious.     Objective:  BP 110/80 (BP Location: Left Arm, Patient Position: Sitting, Cuff Size: Normal)   Pulse 71   Temp 98 F (36.7 C) (Oral)   Ht '5\' 9"'$  (1.753 m)   Wt 148 lb (67.1 kg)   SpO2 99%   BMI 21.86 kg/m   BP Readings from Last 3 Encounters:  03/30/22 110/80  12/28/21 120/78  09/03/21 100/64    Wt Readings from Last 3 Encounters:  03/30/22 148 lb (67.1 kg)  12/28/21 142 lb (64.4 kg)  09/03/21 150 lb (68 kg)    Physical Exam Constitutional:      General: He is not in acute distress.    Appearance: Normal appearance. He is well-developed.     Comments: NAD  Eyes:     Conjunctiva/sclera: Conjunctivae normal.     Pupils: Pupils are equal, round, and reactive to light.  Neck:     Thyroid: No thyromegaly.     Vascular: No JVD.  Cardiovascular:     Rate  and Rhythm: Normal rate and regular rhythm.     Heart sounds: Normal heart sounds. No murmur heard.    No friction rub. No gallop.  Pulmonary:     Effort: Pulmonary effort is normal. No respiratory distress.     Breath sounds: Normal breath sounds. No wheezing or rales.  Chest:     Chest wall: No tenderness.  Abdominal:     General: Bowel sounds are normal. There is no distension.     Palpations: Abdomen is soft. There is no mass.     Tenderness: There is no abdominal tenderness. There is no guarding or rebound.  Musculoskeletal:        General: No tenderness. Normal range of motion.     Cervical back: Normal range of motion.  Lymphadenopathy:     Cervical: No cervical adenopathy.   Skin:    General: Skin is warm and dry.     Findings: No rash.  Neurological:     Mental Status: He is alert and oriented to person, place, and time.     Cranial Nerves: No cranial nerve deficit.     Motor: No abnormal muscle tone.     Coordination: Coordination normal.     Gait: Gait normal.     Deep Tendon Reflexes: Reflexes are normal and symmetric.  Psychiatric:        Behavior: Behavior normal.        Thought Content: Thought content normal.        Judgment: Judgment normal.     Lab Results  Component Value Date   WBC 6.1 12/28/2021   HGB 13.1 12/28/2021   HCT 40.4 12/28/2021   PLT 257.0 12/28/2021   GLUCOSE 86 12/28/2021   CHOL 189 12/28/2021   TRIG 44.0 12/28/2021   HDL 71.60 12/28/2021   LDLCALC 108 (H) 12/28/2021   ALT 18 12/28/2021   AST 17 12/28/2021   NA 139 12/28/2021   K 4.6 12/28/2021   CL 102 12/28/2021   CREATININE 1.10 12/28/2021   BUN 18 12/28/2021   CO2 28 12/28/2021   TSH 0.67 12/28/2021   PSA 2.78 12/28/2021   INR 1.3 (H) 05/07/2019   HGBA1C 5.7 12/28/2021    CT CARDIAC SCORING (SELF PAY ONLY)  Addendum Date: 02/12/2022   ADDENDUM REPORT: 02/12/2022 15:31 EXAM: OVER-READ INTERPRETATION  CT CHEST The following report is an over-read performed by radiologist Dr. Alvino Blood [facility] on 02/12/2022. This over-read does not include interpretation of cardiac or coronary anatomy or pathology. The calcium score interpretation by the cardiologist is attached. COMPARISON:  None. FINDINGS: Limited view of the lung parenchyma demonstrates no suspicious nodularity. Airways are normal. Limited view of the mediastinum demonstrates no adenopathy. Esophagus normal. Limited view of the upper abdomen unremarkable. Limited view of the skeleton and chest wall is unremarkable. IMPRESSION: No significant extracardiac findings. Electronically Signed   By: Suzy Bouchard M.D.   On: 02/12/2022 15:31   Result Date: 02/12/2022 CLINICAL DATA:   Cardiovascular disease risk stratification CAD screening, low CAD risk Dyslipidemia. EXAM: CT Coronary Calcium Score TECHNIQUE: A gated, non-contrast computed tomography scan of the heart was performed using 38m slice thickness. Axial images were analyzed on a dedicated workstation. Calcium scoring of the coronary arteries was performed using the Agatston method. FINDINGS: Coronary Calcium Score: Left main: 0.9 Left anterior descending artery: 2.24 Left circumflex artery: 0 Right coronary artery: 0 Total: 3.2 Percentile: 62nd Pericardium: Normal. Ascending Aorta: Normal caliber. Ascending aorta measures approximately 362mat the  mid ascending aorta measured in an axial plane. Non-cardiac: See separate report from [facility]. IMPRESSION: Coronary calcium score of 3.2. This was 62nd percentile for age-, race-, and sex-matched controls. RECOMMENDATIONS: Coronary artery calcium (CAC) score is a strong predictor of incident coronary heart disease (CHD) and provides predictive information beyond traditional risk factors. CAC scoring is reasonable to use in the decision to withhold, postpone, or initiate statin therapy in intermediate-risk or selected borderline-risk asymptomatic adults (age 31-75 years and LDL-C >=70 to <190 mg/dL) who do not have diabetes or established atherosclerotic cardiovascular disease (ASCVD).* In intermediate-risk (10-year ASCVD risk >=7.5% to <20%) adults or selected borderline-risk (10-year ASCVD risk >=5% to <7.5%) adults in whom a CAC score is measured for the purpose of making a treatment decision the following recommendations have been made: If CAC=0, it is reasonable to withhold statin therapy and reassess in 5 to 10 years, as long as higher risk conditions are absent (diabetes mellitus, family history of premature CHD in first degree relatives (males <55 years; females <65 years), cigarette smoking, or LDL >=190 mg/dL). If CAC is 1 to 99, it is reasonable to initiate statin  therapy for patients >=19 years of age. If CAC is >=100 or >=75th percentile, it is reasonable to initiate statin therapy at any age. Cardiology referral should be considered for patients with CAC scores >=400 or >=75th percentile. *2018 AHA/ACC/AACVPR/AAPA/ABC/ACPM/ADA/AGS/APhA/ASPC/NLA/PCNA Guideline on the Management of Blood Cholesterol: A Report of the American College of Cardiology/American Heart Association Task Force on Clinical Practice Guidelines. J Am Coll Cardiol. 2019;73(24):3168-3209. Electronically Signed: By: Cherlynn Kaiser M.D. On: 02/12/2022 14:26    Assessment & Plan:   Problem List Items Addressed This Visit     Eczema    Worse.  Start Temovate cream      Hypothyroidism    Post-thyroidectomy and radioactive iodine treatment      Insomnia    Rare low dose Zolpidem prn  Potential benefits of a long term benzodiazepines  use as well as potential risks  and complications were explained to the patient and were aknowledged.      Situational depression - Primary     Lexapro - low dose; not taking We can start Trintellix 5 mg/d      Relevant Medications   vortioxetine HBr (TRINTELLIX) 5 MG TABS tablet   Other Relevant Orders   Ambulatory referral to Psychology   Thyroid cancer [facility])    S/p thyroidectomy      Other Visit Diagnoses     Need for shingles vaccine       Relevant Orders   Varicella-zoster vaccine IM (Completed)         Meds ordered this encounter  Medications   zolpidem (AMBIEN) 10 MG tablet    Sig: Take 0.5-1 tablets (5-10 mg total) by mouth at bedtime as needed for sleep.    Dispense:  30 tablet    Refill:  3   clobetasol cream (TEMOVATE) 0.05 %    Sig: Apply 1 application  topically 2 (two) times daily.    Dispense:  60 g    Refill:  3   vortioxetine HBr (TRINTELLIX) 5 MG TABS tablet    Sig: Take 1 tablet (5 mg total) by mouth daily.    Dispense:  30 tablet    Refill:  5      Follow-up: Return in about 3 months (around  06/30/2022) for a follow-up visit.  Walker Kehr, MD

## 2022-03-30 NOTE — Assessment & Plan Note (Signed)
Post-thyroidectomy and radioactive iodine treatment

## 2022-03-30 NOTE — Assessment & Plan Note (Signed)
S/p thyroidectomy

## 2022-04-19 ENCOUNTER — Encounter: Payer: Self-pay | Admitting: Internal Medicine

## 2022-04-26 NOTE — Assessment & Plan Note (Signed)
Worse.  Start Temovate cream

## 2022-05-12 ENCOUNTER — Other Ambulatory Visit: Payer: Self-pay

## 2022-05-12 ENCOUNTER — Other Ambulatory Visit: Payer: BC Managed Care – PPO

## 2022-05-12 DIAGNOSIS — B181 Chronic viral hepatitis B without delta-agent: Secondary | ICD-10-CM

## 2022-08-20 ENCOUNTER — Telehealth (INDEPENDENT_AMBULATORY_CARE_PROVIDER_SITE_OTHER): Payer: BC Managed Care – PPO | Admitting: Nurse Practitioner

## 2022-08-20 DIAGNOSIS — J0100 Acute maxillary sinusitis, unspecified: Secondary | ICD-10-CM | POA: Diagnosis not present

## 2022-08-20 MED ORDER — AMOXICILLIN-POT CLAVULANATE 875-125 MG PO TABS: 1.0000 | ORAL_TABLET | Freq: Two times a day (BID) | ORAL | 0 refills | Status: DC

## 2022-08-20 NOTE — Progress Notes (Signed)
   Established Patient Office Visit  An audio/visual tele-health visit was completed today for this patient. I connected with  Allen Ellis on 08/20/22 utilizing audio/visual technology and verified that I am speaking with the correct person using two identifiers. The patient was located at their home, and I was located at the office of [place] at [facility] during the encounter. I discussed the limitations of evaluation and management by telemedicine. The patient expressed understanding and agreed to proceed.     Subjective   Patient ID: Allen Ellis, male    DOB: Apr 25, 1971  Age: 51 y.o. MRN: [id number]  Chief Complaint  Patient presents with   Sinusitis    Patient arrives for virtual visit for the above.  Reports symptoms started about 2 days ago he is experiencing fever, chills, sinus pain, sore throat, green nasal/postnasal drip discharge.  Reports history of frequent sinus infections.  Per chart review appears last one was approximately 6 months ago at which point he was treated for Augmentin and tolerated well.  Current creatinine clearance based on last metabolic panel in system is over 80.    Review of Systems  Constitutional:  Positive for fever.  HENT:  Positive for ear pain, sinus pain and sore throat.   Respiratory:  Positive for sputum production (green).       Objective:     There were no vitals taken for this visit.   Physical Exam Comprehensive physical exam not completed today as office visit was conducted remotely.  Patient appears fairly well over video, he did have to clear his throat a couple of times.  Patient was alert and oriented, and appeared to have appropriate judgment.   No results found for any visits on 08/20/22.    The 10-year ASCVD risk score (Arnett DK, et al., 2019) is: 1.7%    Assessment & Plan:   Problem List Items Addressed This Visit       Respiratory   Acute non-recurrent maxillary sinusitis - Primary    Acute, did  have discussion regarding the fact that most sinus infections are viral and will resolve on their own within 2 weeks.  Patient reports with his history of having the sinus infections that turned into bacterial infections he would prefer to be treated with antibiotics at this time.  He was educated on risk for developing resistant bacterial infections in the future if he has frequent antibiotic exposure.  Patient reports his understanding.  We will prescribe Augmentin 1 tablet by mouth twice a day x5 days.  Patient encouraged to call office if symptoms persist or worsen.      Relevant Medications   amoxicillin-clavulanate (AUGMENTIN) 875-125 MG tablet    Return if symptoms worsen or fail to improve.    Ailene Ards, NP

## 2022-08-20 NOTE — Assessment & Plan Note (Signed)
Acute, did have discussion regarding the fact that most sinus infections are viral and will resolve on their own within 2 weeks.  Patient reports with his history of having the sinus infections that turned into bacterial infections he would prefer to be treated with antibiotics at this time.  He was educated on risk for developing resistant bacterial infections in the future if he has frequent antibiotic exposure.  Patient reports his understanding.  We will prescribe Augmentin 1 tablet by mouth twice a day x5 days.  Patient encouraged to call office if symptoms persist or worsen.

## 2023-01-05 ENCOUNTER — Encounter: Payer: Self-pay | Admitting: Internal Medicine

## 2023-01-05 ENCOUNTER — Telehealth: Payer: Self-pay | Admitting: Internal Medicine

## 2023-01-05 ENCOUNTER — Telehealth (INDEPENDENT_AMBULATORY_CARE_PROVIDER_SITE_OTHER): Payer: BC Managed Care – PPO | Admitting: Internal Medicine

## 2023-01-05 DIAGNOSIS — J0100 Acute maxillary sinusitis, unspecified: Secondary | ICD-10-CM

## 2023-01-05 MED ORDER — AMOXICILLIN-POT CLAVULANATE 875-125 MG PO TABS: 1.0000 | ORAL_TABLET | Freq: Two times a day (BID) | ORAL | 0 refills | Status: AC

## 2023-01-05 NOTE — Telephone Encounter (Signed)
Pt stated that he would like to discuss with Dr. Carlean Purl about doing his colonoscopy sooner that his recall date due to both of his parents dying from Colon Cancer. Pt stated that he has also been having issues with Hemorrhoids. Pt was scheduled to see Dr. Carlean Purl on 01/28/2023 at 8:50 AM  Pt verbalized understanding with all questions answered.

## 2023-01-05 NOTE — Progress Notes (Signed)
Virtual Visit via telephone note  I connected with Allen Ellis on 01/05/23 at  2:20 PM EDT by telephone and verified that I am speaking with the correct person using two identifiers.   I discussed the limitations of evaluation and management by telemedicine and the availability of in person appointments. The patient expressed understanding and agreed to proceed.  Present for the visit:  Myself, Dr Billey Gosling, Allen Ellis.  The patient is currently at an appointment and I am in the office.    No referring provider.    History of Present Illness: He is here for an acute visit for cold symptoms.  His symptoms started about 5 days ago  He is experiencing chills, nasal congestion with hard, green mucus, sinus pain, sinus headaches, sore throat.  He typically has 2 sinus infections a year and the symptoms are typical for his infections.  He also wonders if there is anything he can do to prevent them.  He has tried taking otc meds.  He is also doing nasal rinses   Review of Systems  Constitutional:  Positive for chills. Negative for fever.  HENT:  Positive for congestion (green mucus, hard), sinus pain and sore throat.   Respiratory:  Negative for cough, shortness of breath and wheezing.   Neurological:  Positive for headaches. Negative for dizziness.      Social History   Socioeconomic History   Marital status: Divorced    Spouse name: Not on file   Number of children: 3   Years of education: Not on file   Highest education level: Not on file  Occupational History   Occupation: pattern Metallurgist: [employer]  Tobacco Use   Smoking status: Former    Types: Cigarettes    Quit date: 09/26/1995    Years since quitting: 27.2   Smokeless tobacco: Never  Vaping Use   Vaping Use: Never used  Substance and Sexual Activity   Alcohol use: Yes    Comment: occasional   Drug use: Never   Sexual activity: Not on file  Other Topics Concern   Not on file  Social History  Narrative   Single, 3 children, teenage son lives with him (born about 2002-3)   From [place] in [place] since 1993   Corporate treasurer at [employer], former smoker, no drugs   Social Determinants of Radio broadcast assistant Strain: Not on file  Food Insecurity: Not on file  Transportation Needs: Not on file  Physical Activity: Not on file  Stress: Not on file  Social Connections: Not on file     Observations/Objective:    Assessment and Plan:  Acute sinus infection: Acute Likely bacterial  Start Augmentin 875-125 mg BID x 10 day otc cold medications Rest, fluid Call if no improvement  Discussed that he could consider seeing an ENT at some point to discuss his sinus infections-will discuss with PCP   Follow Up Instructions:    I discussed the assessment and treatment plan with the patient. The patient was provided an opportunity to ask questions and all were answered. The patient agreed with the plan and demonstrated an understanding of the instructions.   The patient was advised to call back or seek an in-person evaluation if the symptoms worsen or if the condition fails to improve as anticipated.  Time spent on telephone call: 8 minutes  Binnie Rail, MD

## 2023-01-05 NOTE — Telephone Encounter (Signed)
Patient want to speak with a nurse regarding having recall for  colonoscopy he is wanted to do it sooner then his recall letter .Please advise

## 2023-01-17 ENCOUNTER — Telehealth: Payer: Self-pay | Admitting: Internal Medicine

## 2023-01-17 NOTE — Telephone Encounter (Signed)
PT calls today regarding the dosage on their levothyroxine (SYNTHROID) 112 MCG tablet . PT noted that they wanted to get this increased from 112 MCG to 125 MCG. Reason for this being that with the way it is currently prescribed he ends up with too many by the time his next prescription is available.  I did note to PT that he would need a follow up with Dr.Plotnikov and was able to schedule him for 04/15. (PT not seen since 03/30/22 and was informed to follow back up in 3 months)  CB: [phone]

## 2023-01-19 NOTE — Telephone Encounter (Signed)
Keep ROV Thx 

## 2023-01-20 ENCOUNTER — Other Ambulatory Visit: Payer: Self-pay | Admitting: Internal Medicine

## 2023-01-20 DIAGNOSIS — C73 Malignant neoplasm of thyroid gland: Secondary | ICD-10-CM

## 2023-01-28 ENCOUNTER — Ambulatory Visit: Payer: BC Managed Care – PPO | Admitting: Internal Medicine

## 2023-01-31 ENCOUNTER — Encounter: Payer: Self-pay | Admitting: Internal Medicine

## 2023-01-31 ENCOUNTER — Ambulatory Visit: Payer: BC Managed Care – PPO | Admitting: Internal Medicine

## 2023-01-31 VITALS — BP 118/72 | HR 76 | Temp 98.0°F | Ht 69.0 in | Wt 145.0 lb

## 2023-01-31 DIAGNOSIS — R413 Other amnesia: Secondary | ICD-10-CM

## 2023-01-31 DIAGNOSIS — Z0001 Encounter for general adult medical examination with abnormal findings: Secondary | ICD-10-CM | POA: Diagnosis not present

## 2023-01-31 DIAGNOSIS — B181 Chronic viral hepatitis B without delta-agent: Secondary | ICD-10-CM | POA: Diagnosis not present

## 2023-01-31 DIAGNOSIS — L729 Follicular cyst of the skin and subcutaneous tissue, unspecified: Secondary | ICD-10-CM | POA: Insufficient documentation

## 2023-01-31 DIAGNOSIS — L308 Other specified dermatitis: Secondary | ICD-10-CM | POA: Diagnosis not present

## 2023-01-31 DIAGNOSIS — C73 Malignant neoplasm of thyroid gland: Secondary | ICD-10-CM | POA: Diagnosis not present

## 2023-01-31 DIAGNOSIS — F4321 Adjustment disorder with depressed mood: Secondary | ICD-10-CM

## 2023-01-31 DIAGNOSIS — Z0000 Encounter for general adult medical examination without abnormal findings: Secondary | ICD-10-CM

## 2023-01-31 DIAGNOSIS — I2510 Atherosclerotic heart disease of native coronary artery without angina pectoris: Secondary | ICD-10-CM

## 2023-01-31 DIAGNOSIS — Z1211 Encounter for screening for malignant neoplasm of colon: Secondary | ICD-10-CM

## 2023-01-31 DIAGNOSIS — R739 Hyperglycemia, unspecified: Secondary | ICD-10-CM

## 2023-01-31 DIAGNOSIS — I2583 Coronary atherosclerosis due to lipid rich plaque: Secondary | ICD-10-CM

## 2023-01-31 DIAGNOSIS — F5102 Adjustment insomnia: Secondary | ICD-10-CM | POA: Diagnosis not present

## 2023-01-31 MED ORDER — ZALEPLON 5 MG PO CAPS: 5.0000 mg | ORAL_CAPSULE | Freq: Every evening | ORAL | 3 refills | Status: DC | PRN

## 2023-01-31 MED ORDER — KETOCONAZOLE 2 % EX CREA: 1.0000 | TOPICAL_CREAM | Freq: Two times a day (BID) | CUTANEOUS | 1 refills | Status: DC

## 2023-01-31 MED ORDER — CLOBETASOL PROPIONATE 0.05 % EX CREA: 1.0000 | TOPICAL_CREAM | Freq: Two times a day (BID) | CUTANEOUS | 3 refills | Status: DC

## 2023-01-31 MED ORDER — VALACYCLOVIR HCL 500 MG PO TABS: 500.0000 mg | ORAL_TABLET | Freq: Two times a day (BID) | ORAL | 2 refills | Status: DC

## 2023-01-31 NOTE — Assessment & Plan Note (Signed)
CT coronary calcium score is 3.2. On fish oil

## 2023-01-31 NOTE — Patient Instructions (Signed)
You can try Lion's Mane Mushroom capsules for memory problems, decreased focus, mental fog, neuropathy ([email or web address])   

## 2023-01-31 NOTE — Assessment & Plan Note (Signed)
Discussed - stress related Rare low dose Zolpidem prn - d/c.  Try Zaleplon  Potential benefits of a long term benzodiazepines  use as well as potential risks  and complications were explained to the patient and were aknowledged.

## 2023-01-31 NOTE — Assessment & Plan Note (Signed)
On replacement Synthroid

## 2023-01-31 NOTE — Assessment & Plan Note (Signed)
L leg Derm ref

## 2023-01-31 NOTE — Assessment & Plan Note (Signed)
Chronic On Temovate cream prn Try Ketoconazole

## 2023-01-31 NOTE — Assessment & Plan Note (Signed)
Discussed tests, options Start Lion's mane Sleep more Cut back on networking

## 2023-01-31 NOTE — Progress Notes (Signed)
Subjective:  Patient ID: Allen Ellis, male    DOB: 01/19/1971  Age: 52 y.o. MRN: [id number]  CC: Follow-up   HPI Allen Ellis presents for a well exam F/u Hepatitis B, hypothyroidism, h/o thyroid cancer C/o memory problems lately C/o not sleeping well  Pt increased levothyroxine to 125 mcg/d 2 wks ago  Outpatient Medications Prior to Visit  Medication Sig Dispense Refill   Cholecalciferol (VITAMIN D3) 50 MCG (2000 UT) capsule Take 1 capsule (2,000 Units total) by mouth daily. 100 capsule 3   SYNTHROID 125 MCG tablet Take 1 tablet (125 mcg total) by mouth daily before breakfast. Annual appt is due w/labs must see provider for future refills 30 tablet 0   vitamin C (ASCORBIC ACID) 500 MG tablet Take 500 mg by mouth daily.     clobetasol cream (TEMOVATE) 0.05 % Apply 1 application  topically 2 (two) times daily. 60 g 3   valACYclovir (VALTREX) 500 MG tablet Take 1 tablet (500 mg total) by mouth 2 (two) times daily. 14 tablet 2   vortioxetine HBr (TRINTELLIX) 5 MG TABS tablet Take 1 tablet (5 mg total) by mouth daily. (Patient not taking: Reported on 08/20/2022) 30 tablet 5   zolpidem (AMBIEN) 10 MG tablet Take 0.5-1 tablets (5-10 mg total) by mouth at bedtime as needed for sleep. 30 tablet 3   No facility-administered medications prior to visit.    ROS: Review of Systems  Constitutional:  Negative for appetite change, fatigue and unexpected weight change.  HENT:  Negative for congestion, nosebleeds, sneezing, sore throat and trouble swallowing.   Eyes:  Negative for itching and visual disturbance.  Respiratory:  Negative for cough.   Cardiovascular:  Negative for chest pain, palpitations and leg swelling.  Gastrointestinal:  Negative for abdominal distention, blood in stool, diarrhea and nausea.  Genitourinary:  Negative for frequency and hematuria.  Musculoskeletal:  Negative for back pain, gait problem, joint swelling and neck pain.  Skin:  Negative for rash.  Neurological:   Negative for dizziness, tremors, speech difficulty and weakness.  Psychiatric/Behavioral:  Negative for agitation, dysphoric mood and sleep disturbance. The patient is not nervous/anxious.     Objective:  BP 118/72 (BP Location: Left Arm, Patient Position: Sitting, Cuff Size: Normal)   Pulse 76   Temp 98 F (36.7 C) (Oral)   Ht  (1.753 m)   Wt 145 lb (65.8 kg)   SpO2 98%   BMI 21.41 kg/m   BP Readings from Last 3 Encounters:  01/31/23 118/72  03/30/22 110/80  12/28/21 120/78    Wt Readings from Last 3 Encounters:  01/31/23 145 lb (65.8 kg)  03/30/22 148 lb (67.1 kg)  12/28/21 142 lb (64.4 kg)    Physical Exam Constitutional:      General: He is not in acute distress.    Appearance: He is well-developed.     Comments: NAD  Eyes:     Conjunctiva/sclera: Conjunctivae normal.     Pupils: Pupils are equal, round, and reactive to light.  Neck:     Thyroid: No thyromegaly.     Vascular: No JVD.  Cardiovascular:     Rate and Rhythm: Normal rate and regular rhythm.     Heart sounds: Normal heart sounds. No murmur heard.    No friction rub. No gallop.  Pulmonary:     Effort: Pulmonary effort is normal. No respiratory distress.     Breath sounds: Normal breath sounds. No wheezing or rales.  Chest:  Chest wall: No tenderness.  Abdominal:     General: Bowel sounds are normal. There is no distension.     Palpations: Abdomen is soft. There is no mass.     Tenderness: There is no abdominal tenderness. There is no guarding or rebound.  Musculoskeletal:        General: No tenderness. Normal range of motion.     Cervical back: Normal range of motion.  Lymphadenopathy:     Cervical: No cervical adenopathy.  Skin:    General: Skin is warm and dry.     Findings: No rash.  Neurological:     Mental Status: He is alert and oriented to person, place, and time.     Cranial Nerves: No cranial nerve deficit.     Motor: No abnormal muscle tone.     Coordination: Coordination  normal.     Gait: Gait normal.     Deep Tendon Reflexes: Reflexes are normal and symmetric.  Psychiatric:        Behavior: Behavior normal.        Thought Content: Thought content normal.        Judgment: Judgment normal.   Cyst L post leg  I spent 22 minutes in addition to time for CPX wellness examination in preparing to see the patient by review of recent labs, imaging and procedures, obtaining and reviewing separately obtained history, communicating with the patient, ordering medications, tests or procedures, and documenting clinical information in the EHR including the differential diagnosis, treatment, and any further evaluation and other management of insomnia, hypothyroidism, hep B.         Lab Results  Component Value Date   WBC 6.1 12/28/2021   HGB 13.1 12/28/2021   HCT 40.4 12/28/2021   PLT 257.0 12/28/2021   GLUCOSE 86 12/28/2021   CHOL 189 12/28/2021   TRIG 44.0 12/28/2021   HDL 71.60 12/28/2021   LDLCALC 108 (H) 12/28/2021   ALT 18 12/28/2021   AST 17 12/28/2021   NA 139 12/28/2021   K 4.6 12/28/2021   CL 102 12/28/2021   CREATININE 1.10 12/28/2021   BUN 18 12/28/2021   CO2 28 12/28/2021   TSH 0.67 12/28/2021   PSA 2.78 12/28/2021   INR 1.3 (H) 05/07/2019   HGBA1C 5.7 12/28/2021    CT CARDIAC SCORING (SELF PAY ONLY)  Addendum Date: 02/12/2022   ADDENDUM REPORT: 02/12/2022 15:31 EXAM: OVER-READ INTERPRETATION  CT CHEST The following report is an over-read performed by radiologist Dr. Maryelizabeth Rowan [facility] on 02/12/2022. This over-read does not include interpretation of cardiac or coronary anatomy or pathology. The calcium score interpretation by the cardiologist is attached. COMPARISON:  None. FINDINGS: Limited view of the lung parenchyma demonstrates no suspicious nodularity. Airways are normal. Limited view of the mediastinum demonstrates no adenopathy. Esophagus normal. Limited view of the upper abdomen unremarkable. Limited view of the  skeleton and chest wall is unremarkable. IMPRESSION: No significant extracardiac findings. Electronically Signed   By: Genevive Bi M.D.   On: 02/12/2022 15:31   Result Date: 02/12/2022 CLINICAL DATA:  Cardiovascular disease risk stratification CAD screening, low CAD risk Dyslipidemia. EXAM: CT Coronary Calcium Score TECHNIQUE: A gated, non-contrast computed tomography scan of the heart was performed using 3mm slice thickness. Axial images were analyzed on a dedicated workstation. Calcium scoring of the coronary arteries was performed using the Agatston method. FINDINGS: Coronary Calcium Score: Left main: 0.9 Left anterior descending artery: 2.24 Left circumflex artery: 0 Right coronary artery: 0 Total:  3.2 Percentile: 62nd Pericardium: Normal. Ascending Aorta: Normal caliber. Ascending aorta measures approximately 40mm at the mid ascending aorta measured in an axial plane. Non-cardiac: See separate report from [facility]. IMPRESSION: Coronary calcium score of 3.2. This was 62nd percentile for age-, race-, and sex-matched controls. RECOMMENDATIONS: Coronary artery calcium (CAC) score is a strong predictor of incident coronary heart disease (CHD) and provides predictive information beyond traditional risk factors. CAC scoring is reasonable to use in the decision to withhold, postpone, or initiate statin therapy in intermediate-risk or selected borderline-risk asymptomatic adults (age 71-75 years and LDL-C >=70 to <190 mg/dL) who do not have diabetes or established atherosclerotic cardiovascular disease (ASCVD).* In intermediate-risk (10-year ASCVD risk >=7.5% to <20%) adults or selected borderline-risk (10-year ASCVD risk >=5% to <7.5%) adults in whom a CAC score is measured for the purpose of making a treatment decision the following recommendations have been made: If CAC=0, it is reasonable to withhold statin therapy and reassess in 5 to 10 years, as long as higher risk conditions are absent  (diabetes mellitus, family history of premature CHD in first degree relatives (males <55 years; females <65 years), cigarette smoking, or LDL >=190 mg/dL). If CAC is 1 to 99, it is reasonable to initiate statin therapy for patients >=20 years of age. If CAC is >=100 or >=75th percentile, it is reasonable to initiate statin therapy at any age. Cardiology referral should be considered for patients with CAC scores >=400 or >=75th percentile. *2018 AHA/ACC/AACVPR/AAPA/ABC/ACPM/ADA/AGS/APhA/ASPC/NLA/PCNA Guideline on the Management of Blood Cholesterol: A Report of the American College of Cardiology/American Heart Association Task Force on Clinical Practice Guidelines. J Am Coll Cardiol. 2019;73(24):3168-3209. Electronically Signed: By: Weston Brass M.D. On: 02/12/2022 14:26    Assessment & Plan:   Problem List Items Addressed This Visit       Cardiovascular and Mediastinum   Coronary atherosclerosis    CT coronary calcium score is 3.2. On fish oil        Digestive   Chronic hepatitis B    F/u w/Dr Leone Payor Hep B DNA AFP       Relevant Medications   valACYclovir (VALTREX) 500 MG tablet   ketoconazole (NIZORAL) 2 % cream   Other Relevant Orders   AFP tumor marker   Hepatitis B DNA, Ultraquantitative, PCR     Endocrine   Thyroid cancer    On replacement Synthroid      Relevant Medications   valACYclovir (VALTREX) 500 MG tablet   Other Relevant Orders   T4, free     Musculoskeletal and Integument   Eczema    Chronic On Temovate cream prn Try Ketoconazole      Skin cyst    L leg Derm ref      Relevant Orders   Ambulatory referral to Dermatology     Other   Hyperglycemia   Relevant Orders   Hemoglobin A1c   Insomnia    Discussed - stress related Rare low dose Zolpidem prn - d/c.  Try Zaleplon  Potential benefits of a long term benzodiazepines  use as well as potential risks  and complications were explained to the patient and were aknowledged.      Memory  problem    Discussed tests, options Start Lion's mane Sleep more Cut back on networking      Relevant Orders   T4, free   Vitamin B12   VITAMIN D 25 Hydroxy (Vit-D Deficiency, Fractures)   Well adult exam - Primary   Relevant Orders  TSH   Urinalysis   CBC with Differential/Platelet   Lipid panel   PSA   Comprehensive metabolic panel   Other Visit Diagnoses     Situational depression       Relevant Orders   Testosterone   Screening for colon cancer       Relevant Orders   Ambulatory referral to Gastroenterology         Meds ordered this encounter  Medications   valACYclovir (VALTREX) 500 MG tablet    Sig: Take 1 tablet (500 mg total) by mouth 2 (two) times daily.    Dispense:  14 tablet    Refill:  2   clobetasol cream (TEMOVATE) 0.05 %    Sig: Apply 1 Application topically 2 (two) times daily.    Dispense:  60 g    Refill:  3   ketoconazole (NIZORAL) 2 % cream    Sig: Apply 1 Application topically 2 (two) times daily.    Dispense:  30 g    Refill:  1   zaleplon (SONATA) 5 MG capsule    Sig: Take 1-2 capsules (5-10 mg total) by mouth at bedtime as needed for sleep.    Dispense:  60 capsule    Refill:  3      Follow-up: Return in about 3 months (around 05/02/2023) for a follow-up visit.  Sonda Primes, MD

## 2023-01-31 NOTE — Assessment & Plan Note (Signed)
F/u w/Dr Leone Payor Hep B DNA AFP

## 2023-02-07 ENCOUNTER — Other Ambulatory Visit (INDEPENDENT_AMBULATORY_CARE_PROVIDER_SITE_OTHER): Payer: BC Managed Care – PPO

## 2023-02-07 DIAGNOSIS — Z0000 Encounter for general adult medical examination without abnormal findings: Secondary | ICD-10-CM | POA: Diagnosis not present

## 2023-02-07 DIAGNOSIS — Z125 Encounter for screening for malignant neoplasm of prostate: Secondary | ICD-10-CM | POA: Diagnosis not present

## 2023-02-07 DIAGNOSIS — R739 Hyperglycemia, unspecified: Secondary | ICD-10-CM

## 2023-02-07 DIAGNOSIS — C73 Malignant neoplasm of thyroid gland: Secondary | ICD-10-CM

## 2023-02-07 DIAGNOSIS — F4321 Adjustment disorder with depressed mood: Secondary | ICD-10-CM

## 2023-02-07 DIAGNOSIS — B181 Chronic viral hepatitis B without delta-agent: Secondary | ICD-10-CM

## 2023-02-07 DIAGNOSIS — R413 Other amnesia: Secondary | ICD-10-CM | POA: Diagnosis not present

## 2023-02-07 LAB — CBC WITH DIFFERENTIAL/PLATELET
Basophils Absolute: 0 10*3/uL (ref 0.0–0.1)
Basophils Relative: 1 % (ref 0.0–3.0)
Eosinophils Absolute: 0.1 10*3/uL (ref 0.0–0.7)
Eosinophils Relative: 2.5 % (ref 0.0–5.0)
HCT: 42.3 % (ref 39.0–52.0)
Hemoglobin: 13.5 g/dL (ref 13.0–17.0)
Lymphocytes Relative: 43.6 % (ref 12.0–46.0)
Lymphs Abs: 1.9 10*3/uL (ref 0.7–4.0)
MCHC: 32 g/dL (ref 30.0–36.0)
MCV: 74.8 fl — ABNORMAL LOW (ref 78.0–100.0)
Monocytes Absolute: 0.5 10*3/uL (ref 0.1–1.0)
Monocytes Relative: 11.6 % (ref 3.0–12.0)
Neutro Abs: 1.8 10*3/uL (ref 1.4–7.7)
Neutrophils Relative %: 41.3 % — ABNORMAL LOW (ref 43.0–77.0)
Platelets: 269 10*3/uL (ref 150.0–400.0)
RBC: 5.66 Mil/uL (ref 4.22–5.81)
RDW: 14.3 % (ref 11.5–15.5)
WBC: 4.3 10*3/uL (ref 4.0–10.5)

## 2023-02-07 LAB — COMPREHENSIVE METABOLIC PANEL
ALT: 20 U/L (ref 0–53)
AST: 17 U/L (ref 0–37)
Albumin: 4.3 g/dL (ref 3.5–5.2)
Alkaline Phosphatase: 59 U/L (ref 39–117)
BUN: 20 mg/dL (ref 6–23)
CO2: 24 mEq/L (ref 19–32)
Calcium: 9.5 mg/dL (ref 8.4–10.5)
Chloride: 105 mEq/L (ref 96–112)
Creatinine, Ser: 0.95 mg/dL (ref 0.40–1.50)
GFR: 92.92 mL/min (ref >60.00)
Glucose, Bld: 91 mg/dL (ref 70–99)
Potassium: 4.6 mEq/L (ref 3.5–5.1)
Sodium: 138 mEq/L (ref 135–145)
Total Bilirubin: 0.3 mg/dL (ref 0.2–1.2)
Total Protein: 6.8 g/dL (ref 6.0–8.3)

## 2023-02-07 LAB — URINALYSIS
Bilirubin Urine: NEGATIVE
Hgb urine dipstick: NEGATIVE
Leukocytes,Ua: NEGATIVE
Nitrite: NEGATIVE
Specific Gravity, Urine: >=1.03 — AB (ref 1.000–1.030)
Total Protein, Urine: NEGATIVE
Urine Glucose: NEGATIVE
Urobilinogen, UA: 0.2 (ref 0.0–1.0)
pH: 6 (ref 5.0–8.0)

## 2023-02-07 LAB — HEMOGLOBIN A1C: Hgb A1c MFr Bld: 5.7 % (ref 4.6–6.5)

## 2023-02-07 LAB — T4, FREE: Free T4: 1.32 ng/dL (ref 0.60–1.60)

## 2023-02-07 LAB — LIPID PANEL
Cholesterol: 181 mg/dL (ref 0–200)
HDL: 59.9 mg/dL (ref >39.00)
LDL Cholesterol: 108 mg/dL — ABNORMAL HIGH (ref 0–99)
NonHDL: 120.89
Total CHOL/HDL Ratio: 3
Triglycerides: 63 mg/dL (ref 0.0–149.0)
VLDL: 12.6 mg/dL (ref 0.0–40.0)

## 2023-02-07 LAB — PSA: PSA: 3.31 ng/mL (ref 0.10–4.00)

## 2023-02-07 LAB — TSH: TSH: 0.04 u[IU]/mL — ABNORMAL LOW (ref 0.35–5.50)

## 2023-02-07 LAB — VITAMIN B12: Vitamin B-12: 829 pg/mL (ref 211–911)

## 2023-02-07 LAB — VITAMIN D 25 HYDROXY (VIT D DEFICIENCY, FRACTURES): VITD: 67.04 ng/mL (ref 30.00–100.00)

## 2023-02-08 LAB — TESTOSTERONE: Testosterone: 570.85 ng/dL (ref 300.00–890.00)

## 2023-02-09 ENCOUNTER — Other Ambulatory Visit: Payer: Self-pay | Admitting: Internal Medicine

## 2023-02-09 DIAGNOSIS — C73 Malignant neoplasm of thyroid gland: Secondary | ICD-10-CM

## 2023-02-09 MED ORDER — SYNTHROID 125 MCG PO TABS: 125.0000 ug | ORAL_TABLET | Freq: Every day | ORAL | 11 refills | Status: DC

## 2023-02-10 LAB — HEPATITIS B DNA, ULTRAQUANTITATIVE, PCR
Hepatitis B DNA: NOT DETECTED IU/mL
Hepatitis B virus DNA: NOT DETECTED Log IU/mL

## 2023-02-10 LAB — AFP TUMOR MARKER: AFP-Tumor Marker: 2 ng/mL (ref <6.1)

## 2023-02-14 ENCOUNTER — Telehealth: Payer: Self-pay | Admitting: Internal Medicine

## 2023-02-14 ENCOUNTER — Other Ambulatory Visit: Payer: Self-pay

## 2023-02-14 DIAGNOSIS — C73 Malignant neoplasm of thyroid gland: Secondary | ICD-10-CM

## 2023-02-14 MED ORDER — SYNTHROID 125 MCG PO TABS: 125.0000 ug | ORAL_TABLET | Freq: Every day | ORAL | 3 refills | Status: DC

## 2023-02-14 NOTE — Telephone Encounter (Signed)
Prescription Request  02/14/2023  LOV: 01/31/2023  What is the name of the medication or equipment?  SYNTHROID 125 MCG tablet   Have to be a 90 supply  Have you contacted your pharmacy to request a refill? No   Which pharmacy would you like this sent to?  [id number] [place], [place] - [address] [place] [address] Phone: [phone] Fax: [phone]    Patient notified that their request is being sent to the clinical staff for review and that they should receive a response within 2 business days.   Please advise at Mobile [phone] (mobile)

## 2023-02-14 NOTE — Telephone Encounter (Signed)
Rx has been sent in with updated information to pts pharmacy.

## 2023-04-25 ENCOUNTER — Ambulatory Visit: Payer: BC Managed Care – PPO | Admitting: Internal Medicine

## 2023-05-09 ENCOUNTER — Encounter: Payer: Self-pay | Admitting: Internal Medicine

## 2023-05-09 ENCOUNTER — Ambulatory Visit: Payer: BC Managed Care – PPO | Admitting: Internal Medicine

## 2023-05-09 VITALS — BP 110/68 | HR 62 | Temp 98.6°F | Ht 69.0 in | Wt 145.0 lb

## 2023-05-09 DIAGNOSIS — M25512 Pain in left shoulder: Secondary | ICD-10-CM

## 2023-05-09 DIAGNOSIS — M25511 Pain in right shoulder: Secondary | ICD-10-CM | POA: Diagnosis not present

## 2023-05-09 DIAGNOSIS — M542 Cervicalgia: Secondary | ICD-10-CM

## 2023-05-09 DIAGNOSIS — G8929 Other chronic pain: Secondary | ICD-10-CM | POA: Diagnosis not present

## 2023-05-09 DIAGNOSIS — C73 Malignant neoplasm of thyroid gland: Secondary | ICD-10-CM

## 2023-05-09 LAB — TSH: TSH: 0.18 u[IU]/mL — ABNORMAL LOW (ref 0.35–5.50)

## 2023-05-09 LAB — T4, FREE: Free T4: 0.99 ng/dL (ref 0.60–1.60)

## 2023-05-09 MED ORDER — NAPROXEN 500 MG PO TABS: ORAL_TABLET | ORAL | 2 refills | Status: DC

## 2023-05-09 NOTE — Assessment & Plan Note (Addendum)
Blue-Emu cream  -  2-3 times a day Sports med ref Naproxen po prn

## 2023-05-09 NOTE — Progress Notes (Signed)
Subjective:  Patient ID: Allen Ellis, male    DOB: 1970-11-09  Age: 52 y.o. MRN: [id number]  CC: Follow-up (3 mnth f/u)   HPI Allen Ellis presents for chronic B shoulder pain and neck pain x worse weeks  Outpatient Medications Prior to Visit  Medication Sig Dispense Refill   Cholecalciferol (VITAMIN D3) 50 MCG (2000 UT) capsule Take 1 capsule (2,000 Units total) by mouth daily. 100 capsule 3   clobetasol cream (TEMOVATE) 0.05 % Apply 1 Application topically 2 (two) times daily. 60 g 3   ketoconazole (NIZORAL) 2 % cream Apply 1 Application topically 2 (two) times daily. 30 g 1   SYNTHROID 125 MCG tablet Take 1 tablet (125 mcg total) by mouth daily before breakfast. Annual appt is due w/labs must see provider for future refills 90 tablet 3   valACYclovir (VALTREX) 500 MG tablet Take 1 tablet (500 mg total) by mouth 2 (two) times daily. 14 tablet 2   vitamin C (ASCORBIC ACID) 500 MG tablet Take 500 mg by mouth daily.     zaleplon (SONATA) 5 MG capsule Take 1-2 capsules (5-10 mg total) by mouth at bedtime as needed for sleep. 60 capsule 3   No facility-administered medications prior to visit.    ROS: Review of Systems  Constitutional:  Negative for appetite change, fatigue and unexpected weight change.  HENT:  Negative for congestion, nosebleeds, sneezing, sore throat and trouble swallowing.   Eyes:  Negative for itching and visual disturbance.  Respiratory:  Negative for cough.   Cardiovascular:  Negative for chest pain, palpitations and leg swelling.  Gastrointestinal:  Negative for abdominal distention, blood in stool, diarrhea and nausea.  Genitourinary:  Negative for frequency and hematuria.  Musculoskeletal:  Positive for arthralgias and neck pain. Negative for back pain, gait problem and joint swelling.  Skin:  Negative for rash.  Neurological:  Negative for dizziness, tremors, speech difficulty and weakness.  Psychiatric/Behavioral:  Negative for agitation, dysphoric mood,  sleep disturbance and suicidal ideas. The patient is not nervous/anxious.     Objective:  BP 110/68 (BP Location: Left Arm, Patient Position: Sitting, Cuff Size: Large)   Pulse 62   Temp 98.6 F (37 C) (Oral)   Ht 5\' 9"  (1.753 m)   Wt 145 lb (65.8 kg)   SpO2 98%   BMI 21.41 kg/m   BP Readings from Last 3 Encounters:  05/09/23 110/68  01/31/23 118/72  03/30/22 110/80    Wt Readings from Last 3 Encounters:  05/09/23 145 lb (65.8 kg)  01/31/23 145 lb (65.8 kg)  03/30/22 148 lb (67.1 kg)    Physical Exam Constitutional:      General: He is not in acute distress.    Appearance: He is well-developed.     Comments: NAD  Eyes:     Conjunctiva/sclera: Conjunctivae normal.     Pupils: Pupils are equal, round, and reactive to light.  Neck:     Thyroid: No thyromegaly.     Vascular: No JVD.  Cardiovascular:     Rate and Rhythm: Normal rate and regular rhythm.     Heart sounds: Normal heart sounds. No murmur heard.    No friction rub. No gallop.  Pulmonary:     Effort: Pulmonary effort is normal. No respiratory distress.     Breath sounds: Normal breath sounds. No wheezing or rales.  Chest:     Chest wall: No tenderness.  Abdominal:     General: Bowel sounds are normal. There is no distension.  Palpations: Abdomen is soft. There is no mass.     Tenderness: There is no abdominal tenderness. There is no guarding or rebound.  Musculoskeletal:        General: Tenderness present. Normal range of motion.     Cervical back: Normal range of motion.  Lymphadenopathy:     Cervical: No cervical adenopathy.  Skin:    General: Skin is warm and dry.     Findings: No rash.  Neurological:     Mental Status: He is alert and oriented to person, place, and time.     Cranial Nerves: No cranial nerve deficit.     Motor: No abnormal muscle tone.     Coordination: Coordination normal.     Gait: Gait normal.     Deep Tendon Reflexes: Reflexes are normal and symmetric.  Psychiatric:         Behavior: Behavior normal.        Thought Content: Thought content normal.        Judgment: Judgment normal.   B shoulder painful w/ROM Neck stiff w/pain   Lab Results  Component Value Date   WBC 4.3 02/07/2023   HGB 13.5 02/07/2023   HCT 42.3 02/07/2023   PLT 269.0 02/07/2023   GLUCOSE 91 02/07/2023   CHOL 181 02/07/2023   TRIG 63.0 02/07/2023   HDL 59.90 02/07/2023   LDLCALC 108 (H) 02/07/2023   ALT 20 02/07/2023   AST 17 02/07/2023   NA 138 02/07/2023   K 4.6 02/07/2023   CL 105 02/07/2023   CREATININE 0.95 02/07/2023   BUN 20 02/07/2023   CO2 24 02/07/2023   TSH 0.04 (L) 02/07/2023   PSA 3.31 02/07/2023   INR 1.3 (H) 05/07/2019   HGBA1C 5.7 02/07/2023    CT CARDIAC SCORING (SELF PAY ONLY)  Addendum Date: 02/12/2022   ADDENDUM REPORT: 02/12/2022 15:31 EXAM: OVER-READ INTERPRETATION  CT CHEST The following report is an over-read performed by radiologist Dr. Maryelizabeth Rowan [facility] on 02/12/2022. This over-read does not include interpretation of cardiac or coronary anatomy or pathology. The calcium score interpretation by the cardiologist is attached. COMPARISON:  None. FINDINGS: Limited view of the lung parenchyma demonstrates no suspicious nodularity. Airways are normal. Limited view of the mediastinum demonstrates no adenopathy. Esophagus normal. Limited view of the upper abdomen unremarkable. Limited view of the skeleton and chest wall is unremarkable. IMPRESSION: No significant extracardiac findings. Electronically Signed   By: Genevive Bi M.D.   On: 02/12/2022 15:31   Result Date: 02/12/2022 CLINICAL DATA:  Cardiovascular disease risk stratification CAD screening, low CAD risk Dyslipidemia. EXAM: CT Coronary Calcium Score TECHNIQUE: A gated, non-contrast computed tomography scan of the heart was performed using 3mm slice thickness. Axial images were analyzed on a dedicated workstation. Calcium scoring of the coronary arteries was performed using  the Agatston method. FINDINGS: Coronary Calcium Score: Left main: 0.9 Left anterior descending artery: 2.24 Left circumflex artery: 0 Right coronary artery: 0 Total: 3.2 Percentile: 62nd Pericardium: Normal. Ascending Aorta: Normal caliber. Ascending aorta measures approximately 30mm at the mid ascending aorta measured in an axial plane. Non-cardiac: See separate report from [facility]. IMPRESSION: Coronary calcium score of 3.2. This was 62nd percentile for age-, race-, and sex-matched controls. RECOMMENDATIONS: Coronary artery calcium (CAC) score is a strong predictor of incident coronary heart disease (CHD) and provides predictive information beyond traditional risk factors. CAC scoring is reasonable to use in the decision to withhold, postpone, or initiate statin therapy in intermediate-risk or selected  borderline-risk asymptomatic adults (age 50-75 years and LDL-C >=70 to <190 mg/dL) who do not have diabetes or established atherosclerotic cardiovascular disease (ASCVD).* In intermediate-risk (10-year ASCVD risk >=7.5% to <20%) adults or selected borderline-risk (10-year ASCVD risk >=5% to <7.5%) adults in whom a CAC score is measured for the purpose of making a treatment decision the following recommendations have been made: If CAC=0, it is reasonable to withhold statin therapy and reassess in 5 to 10 years, as long as higher risk conditions are absent (diabetes mellitus, family history of premature CHD in first degree relatives (males <55 years; females <65 years), cigarette smoking, or LDL >=190 mg/dL). If CAC is 1 to 99, it is reasonable to initiate statin therapy for patients >=24 years of age. If CAC is >=100 or >=75th percentile, it is reasonable to initiate statin therapy at any age. Cardiology referral should be considered for patients with CAC scores >=400 or >=75th percentile. *2018 AHA/ACC/AACVPR/AAPA/ABC/ACPM/ADA/AGS/APhA/ASPC/NLA/PCNA Guideline on the Management of Blood Cholesterol: A  Report of the American College of Cardiology/American Heart Association Task Force on Clinical Practice Guidelines. J Am Coll Cardiol. 2019;73(24):3168-3209. Electronically Signed: By: Weston Brass M.D. On: 02/12/2022 14:26    Assessment & Plan:   Problem List Items Addressed This Visit     Thyroid cancer (HCC)    On replacement Synthroid Check TSH      Relevant Orders   T4, free   TSH   Neck pain, chronic - Primary    Blue-Emu cream  -  2-3 times a day Sports med ref Naproxen po prn       Relevant Medications   naproxen (NAPROSYN) 500 MG tablet   Other Relevant Orders   Ambulatory referral to Sports Medicine   Other Visit Diagnoses     Chronic pain of both shoulders       Relevant Medications   naproxen (NAPROSYN) 500 MG tablet   Other Relevant Orders   Ambulatory referral to Sports Medicine         Meds ordered this encounter  Medications   naproxen (NAPROSYN) 500 MG tablet    Sig: 1 po qd-bid pc prn pain    Dispense:  60 tablet    Refill:  2      Follow-up: Return in about 3 months (around 08/09/2023) for a follow-up visit.  Sonda Primes, MD

## 2023-05-09 NOTE — Assessment & Plan Note (Addendum)
On replacement Synthroid Check TSH

## 2023-05-09 NOTE — Patient Instructions (Addendum)
Blue-Emu cream  -  2-3 times a day    Make your own rice sock heating pad at home in minutes:  Step 1: Materials 1 Pair of Socks (make sure there are no synthetic materials in socks!) 1 1/2  or 2 cups of Rice  Step 2: Making the Heating Pad Pour rice into one of the socks. Tie a knot at the top of the sock. Put the sock with the rice in the other sock, knot side first. Tie end of outside sock.  Step 3: Using the Heating Pad Put the heating pad in the microwave for 1-2 minutes. Makes sure to keep an eye on it so it doesn't smoke.  Place under your neck for neck pain and lay down. You can use it n other parts of your body too.

## 2023-06-30 DIAGNOSIS — L821 Other seborrheic keratosis: Secondary | ICD-10-CM | POA: Diagnosis not present

## 2023-06-30 DIAGNOSIS — D2239 Melanocytic nevi of other parts of face: Secondary | ICD-10-CM | POA: Diagnosis not present

## 2023-06-30 DIAGNOSIS — D225 Melanocytic nevi of trunk: Secondary | ICD-10-CM | POA: Diagnosis not present

## 2023-06-30 DIAGNOSIS — D485 Neoplasm of uncertain behavior of skin: Secondary | ICD-10-CM | POA: Diagnosis not present

## 2023-07-06 IMAGING — CT CT CARDIAC CORONARY ARTERY CALCIUM SCORE
3 series · 14 of 20 positions shown, 16 images · non-contrast
Comparison: None.

Addendum:
CLINICAL DATA: Cardiovascular disease risk stratification

CAD screening, low CAD risk Dyslipidemia.
EXAM:
CT Coronary Calcium Score
TECHNIQUE: A gated, non-contrast computed tomography scan of the heart was
performed using 3mm slice thickness. Axial images were analyzed on a
dedicated workstation. Calcium scoring of the coronary arteries was
performed using the Agatston method.

[Series 2: cascseq 2.0 sa36 70% (id) · axial · 0.39mm/px · z∈[-265,-163]mm · 4 of 85 slices shown]
[im 17/85  vessel]
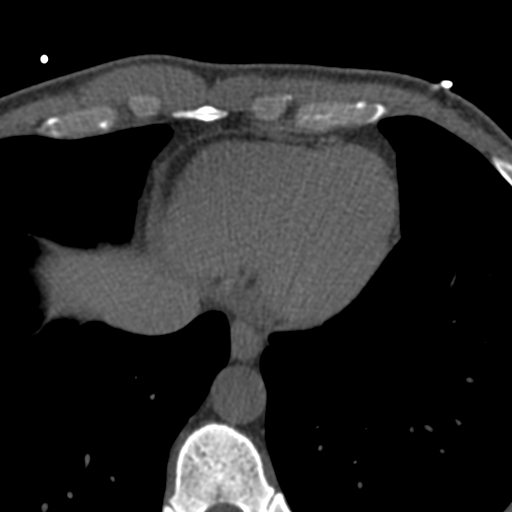
[im 34/85  vessel]
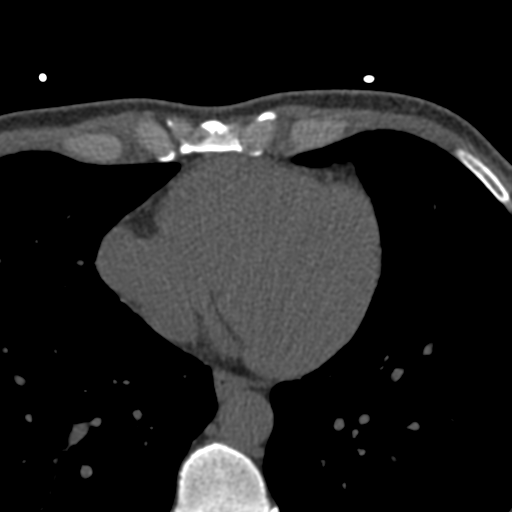
[im 51/85  vessel]
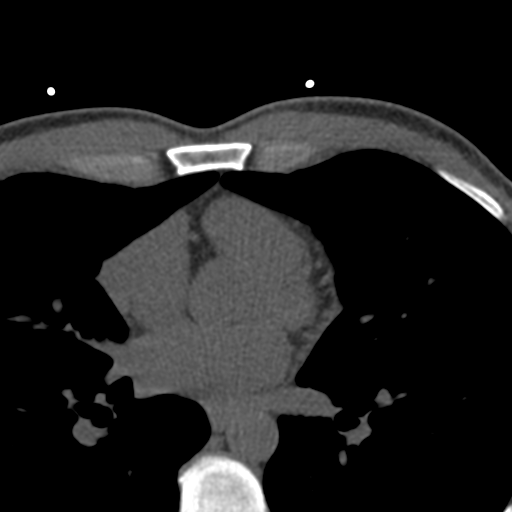
[im 68/85  vessel]
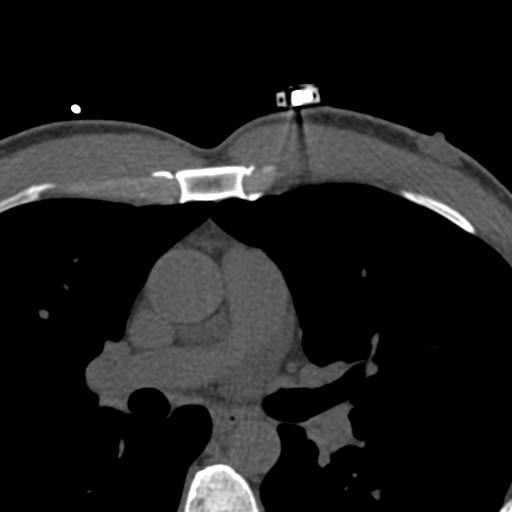

[Series 3: cascseq 2.0 bf37 st · axial · 0.73mm/px · z∈[-269,-157]mm · 5 of 85 slices shown, 7 images]
[im 15/85  vessel]
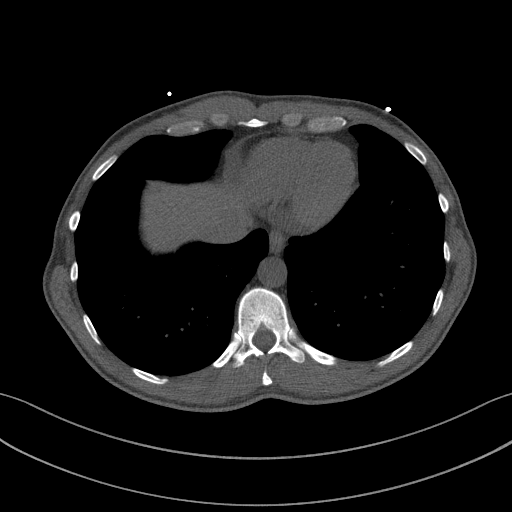
[im 15/85  lung]
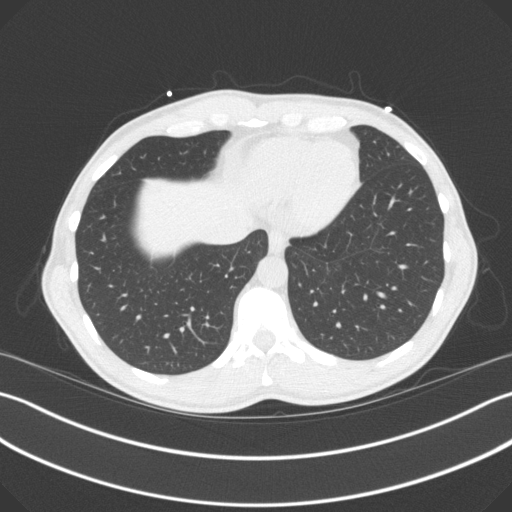
[im 29/85  vessel]
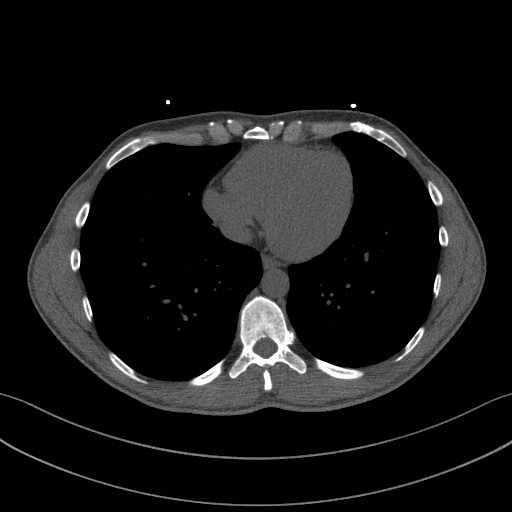
[im 43/85  vessel]
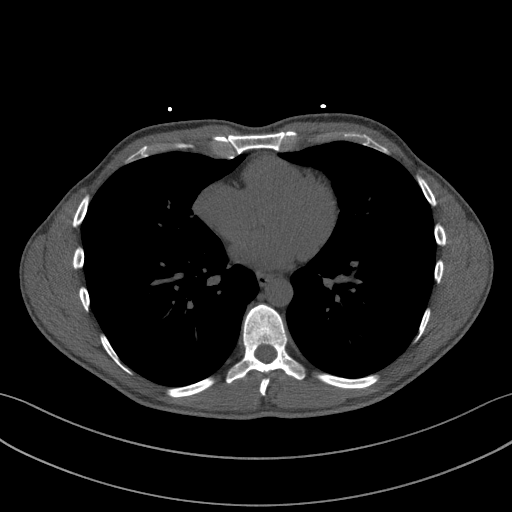
[im 57/85  vessel]
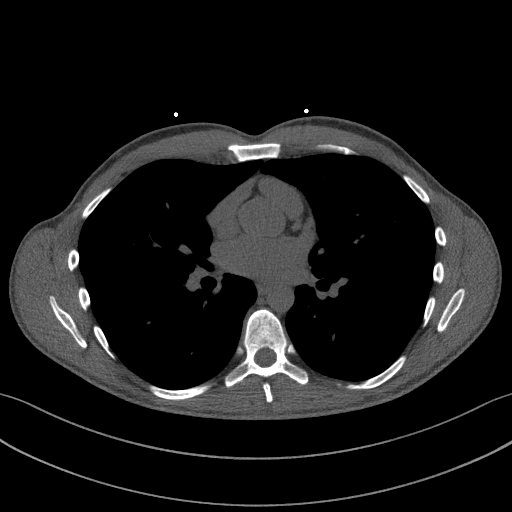
[im 71/85  vessel]
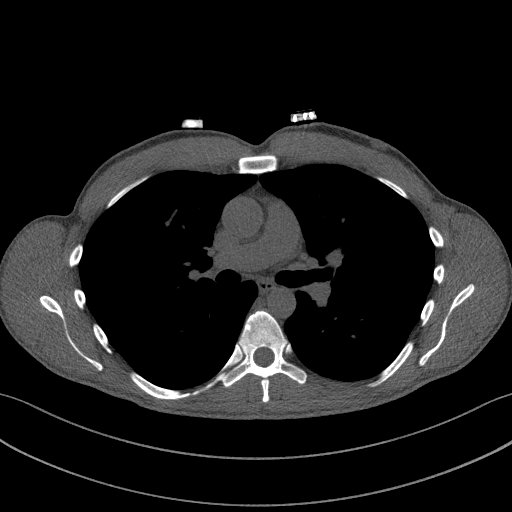
[im 71/85  lung]
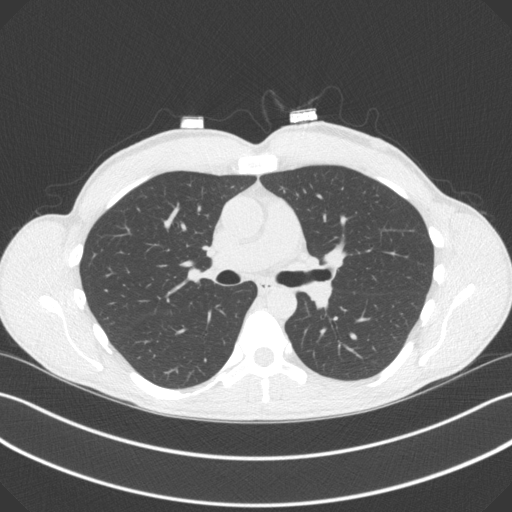

[Series 4: cascseq 2.0 br59 lung · axial · 0.73mm/px · z∈[-269,-157]mm · 5 of 85 slices shown]
[im 15/85  lung]
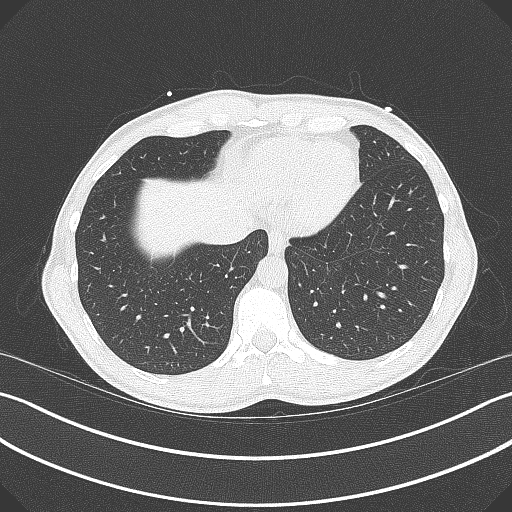
[im 29/85  lung]
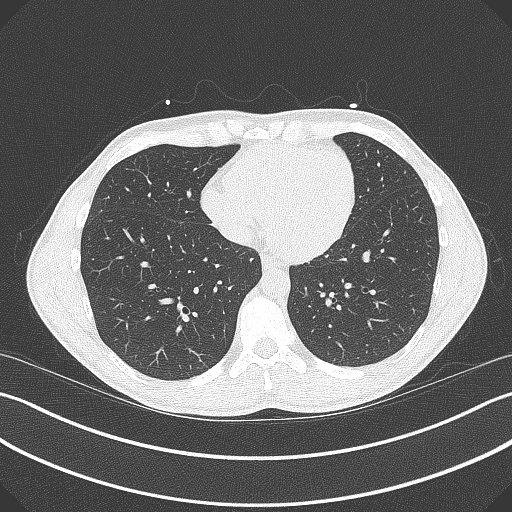
[im 43/85  lung]
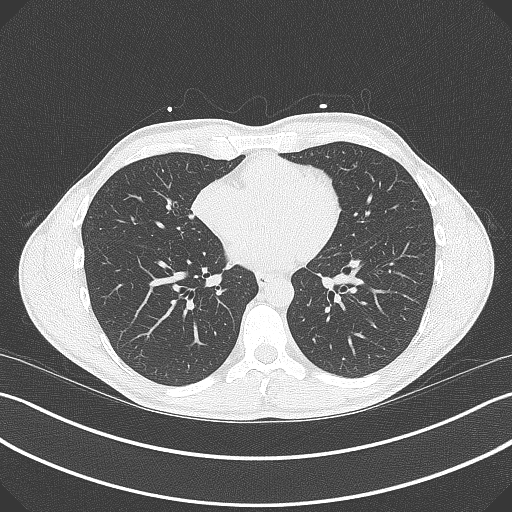
[im 57/85  lung]
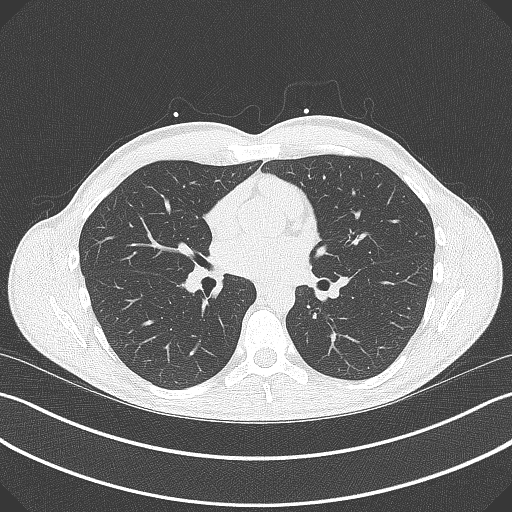
[im 71/85  lung]
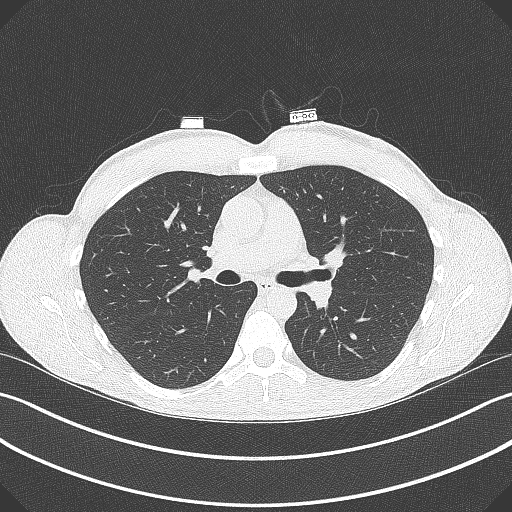

[14 of 20 positions shown; findings below may reference images not displayed]

FINDINGS: Coronary Calcium Score:

Left main:

Left anterior descending artery:

Left circumflex artery: 0

Right coronary artery: 0

Total:

Percentile: 62nd

Pericardium: Normal.

Ascending Aorta: Normal caliber. Ascending aorta measures
approximately 30mm at the mid ascending aorta measured in an axial
plane.

Non-cardiac: See separate report from [REDACTED].
IMPRESSION: Coronary calcium score of 3.2. This was 62nd percentile for age-,
race-, and sex-matched controls.



If CAC=0, it is reasonable to withhold statin therapy and reassess
in 5 to 10 years, as long as higher risk conditions are absent
(diabetes mellitus, family history of premature CHD in first degree
relatives (males <55 years; females <65 years), cigarette smoking,
or LDL >=190 mg/dL).

If CAC is 1 to 99, it is reasonable to initiate statin therapy for
patients >=55 years of age.

If CAC is >=100 or >=75th percentile, it is reasonable to initiate
statin therapy at any age.

Cardiology referral should be considered for patients with CAC
scores >=400 or >=75th percentile.

*6304 AHA/ACC/AACVPR/AAPA/ABC/MESTAN/POLIN/NAYIRI/[place]/LIZA/RADJI/RASMUSSEN
Guideline on the Management of Blood Cholesterol: A Report of the
American College of Cardiology/American Heart Association Task Force
on Clinical Practice Guidelines. J Am Coll Cardiol.
7829;73(24):2508-2184.

EXAM:
OVER-READ INTERPRETATION  CT CHEST

The following report is an over-read performed by radiologist Dr.
over-read does not include interpretation of cardiac or coronary
anatomy or pathology. The calcium score interpretation by the
cardiologist is attached.
FINDINGS: Limited view of the lung parenchyma demonstrates no suspicious
nodularity. Airways are normal.

Limited view of the mediastinum demonstrates no adenopathy.
Esophagus normal.

Limited view of the upper abdomen unremarkable.

Limited view of the skeleton and chest wall is unremarkable.
IMPRESSION: No significant extracardiac findings.

*** End of Addendum ***
FINDINGS: Coronary Calcium Score:

Left main:

Left anterior descending artery:

Left circumflex artery: 0

Right coronary artery: 0

Total:

Percentile: 62nd

Pericardium: Normal.

Ascending Aorta: Normal caliber. Ascending aorta measures
approximately 30mm at the mid ascending aorta measured in an axial
plane.

Non-cardiac: See separate report from [REDACTED].
IMPRESSION: Coronary calcium score of 3.2. This was 62nd percentile for age-,
race-, and sex-matched controls.



If CAC=0, it is reasonable to withhold statin therapy and reassess
in 5 to 10 years, as long as higher risk conditions are absent
(diabetes mellitus, family history of premature CHD in first degree
relatives (males <55 years; females <65 years), cigarette smoking,
or LDL >=190 mg/dL).

If CAC is 1 to 99, it is reasonable to initiate statin therapy for
patients >=55 years of age.

If CAC is >=100 or >=75th percentile, it is reasonable to initiate
statin therapy at any age.

Cardiology referral should be considered for patients with CAC
scores >=400 or >=75th percentile.

*6304 AHA/ACC/AACVPR/AAPA/ABC/MESTAN/POLIN/NAYIRI/[place]/LIZA/RADJI/RASMUSSEN
Guideline on the Management of Blood Cholesterol: A Report of the
American College of Cardiology/American Heart Association Task Force
on Clinical Practice Guidelines. J Am Coll Cardiol.
7829;73(24):2508-2184.

## 2023-07-14 ENCOUNTER — Encounter: Payer: Self-pay | Admitting: Internal Medicine

## 2023-07-14 ENCOUNTER — Ambulatory Visit: Payer: BC Managed Care – PPO | Admitting: Internal Medicine

## 2023-07-14 VITALS — BP 120/70 | HR 69 | Temp 98.6°F | Ht 69.0 in | Wt 147.0 lb

## 2023-07-14 DIAGNOSIS — M26621 Arthralgia of right temporomandibular joint: Secondary | ICD-10-CM

## 2023-07-14 DIAGNOSIS — M542 Cervicalgia: Secondary | ICD-10-CM

## 2023-07-14 DIAGNOSIS — M26629 Arthralgia of temporomandibular joint, unspecified side: Secondary | ICD-10-CM | POA: Insufficient documentation

## 2023-07-14 DIAGNOSIS — G8929 Other chronic pain: Secondary | ICD-10-CM

## 2023-07-14 DIAGNOSIS — C73 Malignant neoplasm of thyroid gland: Secondary | ICD-10-CM | POA: Diagnosis not present

## 2023-07-14 DIAGNOSIS — I2583 Coronary atherosclerosis due to lipid rich plaque: Secondary | ICD-10-CM

## 2023-07-14 DIAGNOSIS — I2510 Atherosclerotic heart disease of native coronary artery without angina pectoris: Secondary | ICD-10-CM

## 2023-07-14 DIAGNOSIS — M545 Low back pain, unspecified: Secondary | ICD-10-CM

## 2023-07-14 MED ORDER — NAPROXEN 500 MG PO TABS: 500.0000 mg | ORAL_TABLET | Freq: Two times a day (BID) | ORAL | 0 refills | Status: DC | PRN

## 2023-07-14 NOTE — Assessment & Plan Note (Signed)
On replacement Synthroid

## 2023-07-14 NOTE — Progress Notes (Signed)
Subjective:  Patient ID: Allen Ellis, male    DOB: February 12, 1971  Age: 52 y.o. MRN: [id number]  CC: Jaw Pain   HPI Tilden Gai presents for R TMJ pain x2 months, worse lately On Keto diet  Outpatient Medications Prior to Visit  Medication Sig Dispense Refill   Cholecalciferol (VITAMIN D3) 50 MCG (2000 UT) capsule Take 1 capsule (2,000 Units total) by mouth daily. 100 capsule 3   clobetasol cream (TEMOVATE) 0.05 % Apply 1 Application topically 2 (two) times daily. 60 g 3   SYNTHROID 125 MCG tablet Take 1 tablet (125 mcg total) by mouth daily before breakfast. Annual appt is due w/labs must see provider for future refills 90 tablet 3   vitamin C (ASCORBIC ACID) 500 MG tablet Take 500 mg by mouth daily.     ketoconazole (NIZORAL) 2 % cream Apply 1 Application topically 2 (two) times daily. 30 g 1   naproxen (NAPROSYN) 500 MG tablet 1 po qd-bid pc prn pain 60 tablet 2   valACYclovir (VALTREX) 500 MG tablet Take 1 tablet (500 mg total) by mouth 2 (two) times daily. 14 tablet 2   zaleplon (SONATA) 5 MG capsule Take 1-2 capsules (5-10 mg total) by mouth at bedtime as needed for sleep. 60 capsule 3   No facility-administered medications prior to visit.    ROS: Review of Systems  Constitutional:  Positive for fatigue. Negative for appetite change and unexpected weight change.  HENT:  Negative for congestion, nosebleeds, sneezing, sore throat and trouble swallowing.   Eyes:  Negative for itching and visual disturbance.  Respiratory:  Negative for cough.   Cardiovascular:  Negative for chest pain, palpitations and leg swelling.  Gastrointestinal:  Negative for abdominal distention, blood in stool, diarrhea and nausea.  Genitourinary:  Negative for frequency and hematuria.  Musculoskeletal:  Negative for back pain, gait problem, joint swelling and neck pain.  Skin:  Negative for rash.  Neurological:  Negative for dizziness, tremors, speech difficulty and weakness.  Psychiatric/Behavioral:   Negative for agitation, dysphoric mood and sleep disturbance. The patient is not nervous/anxious.     Objective:  BP 120/70 (BP Location: Left Arm, Patient Position: Sitting, Cuff Size: Normal)   Pulse 69   Temp 98.6 F (37 C) (Oral)   Ht 5\' 9"  (1.753 m)   Wt 147 lb (66.7 kg)   SpO2 98%   BMI 21.71 kg/m   BP Readings from Last 3 Encounters:  07/14/23 120/70  05/09/23 110/68  01/31/23 118/72    Wt Readings from Last 3 Encounters:  07/14/23 147 lb (66.7 kg)  05/09/23 145 lb (65.8 kg)  01/31/23 145 lb (65.8 kg)    Physical Exam Constitutional:      General: He is not in acute distress.    Appearance: He is well-developed.     Comments: NAD  Eyes:     Conjunctiva/sclera: Conjunctivae normal.     Pupils: Pupils are equal, round, and reactive to light.  Neck:     Thyroid: No thyromegaly.     Vascular: No JVD.  Cardiovascular:     Rate and Rhythm: Normal rate and regular rhythm.     Heart sounds: Normal heart sounds. No murmur heard.    No friction rub. No gallop.  Pulmonary:     Effort: Pulmonary effort is normal. No respiratory distress.     Breath sounds: Normal breath sounds. No wheezing or rales.  Chest:     Chest wall: No tenderness.  Abdominal:  General: Bowel sounds are normal. There is no distension.     Palpations: Abdomen is soft. There is no mass.     Tenderness: There is no abdominal tenderness. There is no guarding or rebound.  Musculoskeletal:        General: No tenderness. Normal range of motion.     Cervical back: Normal range of motion.  Lymphadenopathy:     Cervical: No cervical adenopathy.  Skin:    General: Skin is warm and dry.     Findings: No rash.  Neurological:     Mental Status: He is alert and oriented to person, place, and time.     Cranial Nerves: No cranial nerve deficit.     Motor: No abnormal muscle tone.     Coordination: Coordination normal.     Gait: Gait normal.     Deep Tendon Reflexes: Reflexes are normal and  symmetric.  Psychiatric:        Behavior: Behavior normal.        Thought Content: Thought content normal.        Judgment: Judgment normal.   R TMJ is painful  Lab Results  Component Value Date   WBC 4.3 02/07/2023   HGB 13.5 02/07/2023   HCT 42.3 02/07/2023   PLT 269.0 02/07/2023   GLUCOSE 91 02/07/2023   CHOL 181 02/07/2023   TRIG 63.0 02/07/2023   HDL 59.90 02/07/2023   LDLCALC 108 (H) 02/07/2023   ALT 20 02/07/2023   AST 17 02/07/2023   NA 138 02/07/2023   K 4.6 02/07/2023   CL 105 02/07/2023   CREATININE 0.95 02/07/2023   BUN 20 02/07/2023   CO2 24 02/07/2023   TSH 0.18 (L) 05/09/2023   PSA 3.31 02/07/2023   INR 1.3 (H) 05/07/2019   HGBA1C 5.7 02/07/2023    CT CARDIAC SCORING (SELF PAY ONLY)  Addendum Date: 02/12/2022   ADDENDUM REPORT: 02/12/2022 15:31 EXAM: OVER-READ INTERPRETATION  CT CHEST The following report is an over-read performed by radiologist Dr. Maryelizabeth Rowan [facility] on 02/12/2022. This over-read does not include interpretation of cardiac or coronary anatomy or pathology. The calcium score interpretation by the cardiologist is attached. COMPARISON:  None. FINDINGS: Limited view of the lung parenchyma demonstrates no suspicious nodularity. Airways are normal. Limited view of the mediastinum demonstrates no adenopathy. Esophagus normal. Limited view of the upper abdomen unremarkable. Limited view of the skeleton and chest wall is unremarkable. IMPRESSION: No significant extracardiac findings. Electronically Signed   By: Genevive Bi M.D.   On: 02/12/2022 15:31   Result Date: 02/12/2022 CLINICAL DATA:  Cardiovascular disease risk stratification CAD screening, low CAD risk Dyslipidemia. EXAM: CT Coronary Calcium Score TECHNIQUE: A gated, non-contrast computed tomography scan of the heart was performed using 3mm slice thickness. Axial images were analyzed on a dedicated workstation. Calcium scoring of the coronary arteries was performed using  the Agatston method. FINDINGS: Coronary Calcium Score: Left main: 0.9 Left anterior descending artery: 2.24 Left circumflex artery: 0 Right coronary artery: 0 Total: 3.2 Percentile: 62nd Pericardium: Normal. Ascending Aorta: Normal caliber. Ascending aorta measures approximately 30mm at the mid ascending aorta measured in an axial plane. Non-cardiac: See separate report from [facility]. IMPRESSION: Coronary calcium score of 3.2. This was 62nd percentile for age-, race-, and sex-matched controls. RECOMMENDATIONS: Coronary artery calcium (CAC) score is a strong predictor of incident coronary heart disease (CHD) and provides predictive information beyond traditional risk factors. CAC scoring is reasonable to use in the decision to withhold,  postpone, or initiate statin therapy in intermediate-risk or selected borderline-risk asymptomatic adults (age 58-75 years and LDL-C >=70 to <190 mg/dL) who do not have diabetes or established atherosclerotic cardiovascular disease (ASCVD).* In intermediate-risk (10-year ASCVD risk >=7.5% to <20%) adults or selected borderline-risk (10-year ASCVD risk >=5% to <7.5%) adults in whom a CAC score is measured for the purpose of making a treatment decision the following recommendations have been made: If CAC=0, it is reasonable to withhold statin therapy and reassess in 5 to 10 years, as long as higher risk conditions are absent (diabetes mellitus, family history of premature CHD in first degree relatives (males <55 years; females <65 years), cigarette smoking, or LDL >=190 mg/dL). If CAC is 1 to 99, it is reasonable to initiate statin therapy for patients >=52 years of age. If CAC is >=100 or >=75th percentile, it is reasonable to initiate statin therapy at any age. Cardiology referral should be considered for patients with CAC scores >=400 or >=75th percentile. *2018 AHA/ACC/AACVPR/AAPA/ABC/ACPM/ADA/AGS/APhA/ASPC/NLA/PCNA Guideline on the Management of Blood Cholesterol: A  Report of the American College of Cardiology/American Heart Association Task Force on Clinical Practice Guidelines. J Am Coll Cardiol. 2019;73(24):3168-3209. Electronically Signed: By: Weston Brass M.D. On: 02/12/2022 14:26    Assessment & Plan:   Problem List Items Addressed This Visit     Thyroid cancer (HCC)    On replacement Synthroid      Coronary atherosclerosis    CT coronary calcium score is 3.2. On fish oil      Neck pain, chronic    [facility] ref      Relevant Medications   naproxen (NAPROSYN) 500 MG tablet   Other Relevant Orders   Ambulatory referral to Chiropractic   TMJ arthralgia - Primary    R TMJ [facility] appt tomorrow      Low back pain    [facility]ropract[facility] ref      Relevant Medications   naproxen (NAPROSYN) 500 MG tablet   Other Relevant Orders   Ambulatory referral to Chiropractic      Meds ordered this encounter  Medications   naproxen (NAPROSYN) 500 MG tablet    Sig: Take 1 tablet (500 mg total) by mouth 2 (two) times daily as needed for moderate pain.    Dispense:  60 tablet    Refill:  0      Follow-up: Return in about 4 weeks (around 08/11/2023) for a follow-up visit.  Sonda Primes, MD

## 2023-07-14 NOTE — Assessment & Plan Note (Signed)
R TMJ [facility] appt tomorrow

## 2023-07-14 NOTE — Assessment & Plan Note (Signed)
[facility]rop[facility] ref

## 2023-07-14 NOTE — Assessment & Plan Note (Signed)
CT coronary calcium score is 3.2. On fish oil

## 2023-08-08 DIAGNOSIS — M9901 Segmental and somatic dysfunction of cervical region: Secondary | ICD-10-CM | POA: Diagnosis not present

## 2023-08-08 DIAGNOSIS — M9902 Segmental and somatic dysfunction of thoracic region: Secondary | ICD-10-CM | POA: Diagnosis not present

## 2023-08-08 DIAGNOSIS — M9903 Segmental and somatic dysfunction of lumbar region: Secondary | ICD-10-CM | POA: Diagnosis not present

## 2023-08-08 DIAGNOSIS — M50322 Other cervical disc degeneration at C5-C6 level: Secondary | ICD-10-CM | POA: Diagnosis not present

## 2023-08-11 DIAGNOSIS — M9903 Segmental and somatic dysfunction of lumbar region: Secondary | ICD-10-CM | POA: Diagnosis not present

## 2023-08-11 DIAGNOSIS — M9901 Segmental and somatic dysfunction of cervical region: Secondary | ICD-10-CM | POA: Diagnosis not present

## 2023-08-11 DIAGNOSIS — M50322 Other cervical disc degeneration at C5-C6 level: Secondary | ICD-10-CM | POA: Diagnosis not present

## 2023-08-11 DIAGNOSIS — M9902 Segmental and somatic dysfunction of thoracic region: Secondary | ICD-10-CM | POA: Diagnosis not present

## 2023-08-18 DIAGNOSIS — M9901 Segmental and somatic dysfunction of cervical region: Secondary | ICD-10-CM | POA: Diagnosis not present

## 2023-08-18 DIAGNOSIS — M50322 Other cervical disc degeneration at C5-C6 level: Secondary | ICD-10-CM | POA: Diagnosis not present

## 2023-08-18 DIAGNOSIS — M9903 Segmental and somatic dysfunction of lumbar region: Secondary | ICD-10-CM | POA: Diagnosis not present

## 2023-08-18 DIAGNOSIS — M9902 Segmental and somatic dysfunction of thoracic region: Secondary | ICD-10-CM | POA: Diagnosis not present

## 2023-08-24 ENCOUNTER — Ambulatory Visit: Payer: BC Managed Care – PPO | Admitting: Internal Medicine

## 2023-08-24 ENCOUNTER — Other Ambulatory Visit (INDEPENDENT_AMBULATORY_CARE_PROVIDER_SITE_OTHER): Payer: BC Managed Care – PPO

## 2023-08-24 ENCOUNTER — Encounter: Payer: Self-pay | Admitting: Internal Medicine

## 2023-08-24 VITALS — BP 110/70 | HR 79 | Ht 69.0 in | Wt 147.0 lb

## 2023-08-24 DIAGNOSIS — B181 Chronic viral hepatitis B without delta-agent: Secondary | ICD-10-CM | POA: Diagnosis not present

## 2023-08-24 DIAGNOSIS — Z1211 Encounter for screening for malignant neoplasm of colon: Secondary | ICD-10-CM | POA: Diagnosis not present

## 2023-08-24 DIAGNOSIS — K5900 Constipation, unspecified: Secondary | ICD-10-CM | POA: Diagnosis not present

## 2023-08-24 LAB — HEPATIC FUNCTION PANEL
ALT: 27 U/L (ref 0–53)
AST: 20 U/L (ref 0–37)
Albumin: 4.7 g/dL (ref 3.5–5.2)
Alkaline Phosphatase: 65 U/L (ref 39–117)
Bilirubin, Direct: 0.1 mg/dL (ref 0.0–0.3)
Total Bilirubin: 0.5 mg/dL (ref 0.2–1.2)
Total Protein: 7.8 g/dL (ref 6.0–8.3)

## 2023-08-24 MED ORDER — NA SULFATE-K SULFATE-MG SULF 17.5-3.13-1.6 GM/177ML PO SOLN: 1.0000 | Freq: Once | ORAL | 0 refills | Status: AC

## 2023-08-24 NOTE — Patient Instructions (Addendum)
VISIT SUMMARY:  During today's visit, we discussed your concerns about colon cancer screening, constipation, and follow-up for your chronic Hepatitis B. You expressed interest in scheduling a colonoscopy due to your family history of cancer and reported significant constipation, likely related to your ketogenic diet. We also addressed the need for follow-up lab work and an ultrasound for your Hepatitis B.  YOUR PLAN:   Please start taking Miralax daily to manage constipation before the procedure.  -CHRONIC HEPATITIS B: Chronic Hepatitis B is a long-term infection of the liver. Although you have no current symptoms, it is important to monitor this condition regularly. We will order Hepatitis B labs and an ultrasound today to check your liver health.  -CONSTIPATION: Constipation is when you have infrequent or hard-to-pass bowel movements. This can be uncomfortable and is likely related to your ketogenic diet. To help, start taking Miralax daily.   You have been scheduled for a colonoscopy. Please follow written instructions given to you at your visit today.   Please pick up your prep supplies at the pharmacy within the next 1-3 days.  If you use inhalers (even only as needed), please bring them with you on the day of your procedure.   You have been scheduled for an abdominal ultrasound at  on 10/31/2023 at 8:00am. Please arrive 30 minutes prior to your appointment for registration. Make certain not to have anything to eat or drink 8 hours prior to your appointment. Should you need to reschedule your appointment, please contact radiology at [phone]. This test typically takes about 30 minutes to perform. ___________________________________________________________________________  I appreciate the opportunity to care for you. Stan Head, MD, [facility]

## 2023-08-24 NOTE — Progress Notes (Signed)
Allen Ellis 52 y.o. 10/06/1971 [id number]  Assessment & Plan:   Encounter Diagnoses  Name Primary?   Chronic hepatitis B (HCC) Yes   Colon cancer screening    Constipation from keto diet     Laboratory and ultrasound follow-up of chronic hepatitis B ordered. The patient is now unclear as to when his last colonoscopy was so we will schedule a screening colonoscopy.The risks and benefits as well as alternatives of endoscopic procedure(s) have been discussed and reviewed. All questions answered. The patient agrees to proceed.  Daily MiraLAX for constipation from keto diet.  Orders Placed This Encounter  Procedures   US Abdomen Limited RUQ (LIVER/GB)   Hepatitis B DNA, ultraquantitative, PCR   Hepatic function panel   Ambulatory referral to Gastroenterology   While he was in the office I did not order an AFP test, we will try to add that on if not we will catch up to that when he comes for colonoscopy.   Subjective:  Gastroenterology summary:  Chronic hepatitis B E antigen negative Intermittent surveillance over the years with negative ultrasound, DNA levels and transaminases as well as AFP.   GERD with stricture, status post EGD and 54 [address] Maloney dilation 2021  ----------------------------------------------------------------------- Chief Complaint: Screening colonoscopy question also has hepatitis B  HPI 52 year old white man with a history of chronic hepatitis B, GERD with stricture responding to dilation, presenting with complaints as above.  He was last seen in November 2022.  At that time ultrasound and liver function test and hepatitis B DNA were normal.  He had a prior alpha-fetoprotein that was negative earlier that year.  Discussed the use of AI scribe software for clinical note transcription with the patient, who gave verbal consent to proceed.   The patient, with a history of Hepatitis B, presented with concerns about colon cancer due to a family history  of cancer (kidney and stomach) in both parents. He expressed interest in undergoing a colonoscopy, although he was unsure of the exact timing of his last procedure, in the past  (2022) he had thought it was within 6 years so we scheduled a recall for 2026 but now he says he is not sure that was correct and may have been longer.  The patient also reported significant constipation, which he attributed to his current ketogenic diet and eating pattern of one meal per day. He described bowel movements occurring only once or twice a week, often accompanied by discomfort and a sensation of bloating. The stool was described as hard, contributing to the discomfort. The patient had previously tried flaxseed to alleviate the constipation but had not used any over-the-counter remedies.  In addition to these concerns, the patient was due for follow-up regarding his Hepatitis B, including lab work and an ultrasound. He reported no current symptoms or issues related to this condition.       No Known Allergies Current Meds  Medication Sig   Cholecalciferol (VITAMIN D3) 50 MCG (2000 UT) capsule Take 1 capsule (2,000 Units total) by mouth daily.   clobetasol cream (TEMOVATE) 0.05 % Apply 1 Application topically 2 (two) times daily.   SYNTHROID 125 MCG tablet Take 1 tablet (125 mcg total) by mouth daily before breakfast. Annual appt is due w/labs must see provider for future refills   vitamin C (ASCORBIC ACID) 500 MG tablet Take 500 mg by mouth daily.   Past Medical History:  Diagnosis Date   Chronic hepatitis B (HCC)    Coronary atherosclerosis due to  lipid rich plaque    COVID-19    Eczema    Hemorrhoids    Prediabetes    Thyroid cancer [facility])    Past Surgical History:  Procedure Laterality Date   COLONOSCOPY     ESOPHAGOGASTRODUODENOSCOPY  12/2019   HEMORRHOID SURGERY     TOTAL THYROIDECTOMY     Social History   Social History Narrative   Single, 3 children, teenage son lives with him (born about  2002-3)   From [place] in [place] since 1993   Physiological scientist at [employer], former smoker, no drugs   family history includes Breast cancer in his paternal grandmother; Diabetes in his father and sister; Early death in his mother; Heart attack in his father; Heart disease in his mother; Heart disease (age of onset: 40) in his father; Hyperlipidemia in his mother; Kidney cancer in his father; Stomach cancer in his mother.   Review of Systems As per HPI  Objective:   Physical Exam BP 110/70   Pulse 79   Ht 5\' 9"  (1.753 m)   Wt 147 lb (66.7 kg)   BMI 21.71 kg/m  Physical Exam   CHEST: lungs clear to auscultation CARDIOVASCULAR: heart sounds normal ABDOMEN: flat, soft, nontender, liver not enlarged, spleen not palpable, bowels sounds present EXTREMITIES: no edema

## 2023-08-25 LAB — TIQ- MISLABELED: Test Ordered On Req: 8369

## 2023-08-25 NOTE — Addendum Note (Signed)
Addended by: Sumner Boast on: 08/25/2023 05:23 PM   Modules accepted: Orders

## 2023-08-27 LAB — HEPATITIS B DNA, ULTRAQUANTITATIVE, PCR
Hepatitis B DNA: NOT DETECTED [IU]/mL
Hepatitis B virus DNA: NOT DETECTED {Log_IU}/mL

## 2023-08-27 LAB — AFP TUMOR MARKER: AFP-Tumor Marker: 1.8 ng/mL (ref <6.1)

## 2023-08-31 ENCOUNTER — Other Ambulatory Visit: Payer: BC Managed Care – PPO

## 2023-08-31 DIAGNOSIS — B181 Chronic viral hepatitis B without delta-agent: Secondary | ICD-10-CM | POA: Diagnosis not present

## 2023-09-02 LAB — HEPATITIS B DNA, ULTRAQUANTITATIVE, PCR
Hepatitis B DNA: NOT DETECTED [IU]/mL
Hepatitis B virus DNA: NOT DETECTED {Log_IU}/mL

## 2023-09-06 NOTE — Progress Notes (Signed)
Per Clydie Braun in the lab [facility] will credit the first test run.

## 2023-09-08 ENCOUNTER — Encounter: Payer: Self-pay | Admitting: Internal Medicine

## 2023-09-26 NOTE — Progress Notes (Unsigned)
[facility] History and Physical   Primary Care Physician:  Tresa Garter, MD   Reason for Procedure:   Colon cancer screening  Plan:    colonoscopy     HPI: Allen Ellis is a 52 y.o. male here for screening exam.   Past Medical History:  Diagnosis Date   Chronic hepatitis B (HCC)    Coronary atherosclerosis due to lipid rich plaque    COVID-19    Eczema    GERD with stricture 2021   Hemorrhoids    Prediabetes    Thyroid cancer [facility])     Past Surgical History:  Procedure Laterality Date   COLONOSCOPY     ESOPHAGOGASTRODUODENOSCOPY  12/2019   HEMORRHOID SURGERY     TOTAL THYROIDECTOMY      Prior to Admission medications   Medication Sig Start Date End Date Taking? Authorizing Provider  Cholecalciferol (VITAMIN D3) 50 MCG (2000 UT) capsule Take 1 capsule (2,000 Units total) by mouth daily. 10/31/18   Plotnikov, Georgina Quint, MD  clobetasol cream (TEMOVATE) 0.05 % Apply 1 Application topically 2 (two) times daily. 01/31/23   Plotnikov, Georgina Quint, MD  naproxen (NAPROSYN) 500 MG tablet Take 1 tablet (500 mg total) by mouth 2 (two) times daily as needed for moderate pain. Patient not taking: Reported on 08/24/2023 07/14/23   Plotnikov, Georgina Quint, MD  SYNTHROID 125 MCG tablet Take 1 tablet (125 mcg total) by mouth daily before breakfast. Annual appt is due w/labs must see provider for future refills 02/14/23   Plotnikov, Georgina Quint, MD  vitamin C (ASCORBIC ACID) 500 MG tablet Take 500 mg by mouth daily.    [provider]    Current Outpatient Medications  Medication Sig Dispense Refill   Cholecalciferol (VITAMIN D3) 50 MCG (2000 UT) capsule Take 1 capsule (2,000 Units total) by mouth daily. 100 capsule 3   clobetasol cream (TEMOVATE) 0.05 % Apply 1 Application topically 2 (two) times daily. 60 g 3   naproxen (NAPROSYN) 500 MG tablet Take 1 tablet (500 mg total) by mouth 2 (two) times daily as needed for moderate pain. (Patient not taking: Reported on  08/24/2023) 60 tablet 0   SYNTHROID 125 MCG tablet Take 1 tablet (125 mcg total) by mouth daily before breakfast. Annual appt is due w/labs must see provider for future refills 90 tablet 3   vitamin C (ASCORBIC ACID) 500 MG tablet Take 500 mg by mouth daily.     No current facility-administered medications for this visit.    Allergies as of 09/27/2023   (No Known Allergies)    Family History  Problem Relation Age of Onset   Heart disease Mother    Stomach cancer Mother    Early death Mother    Hyperlipidemia Mother    Heart attack Father    Kidney cancer Father        mets    Diabetes Father    Heart disease Father 25   Diabetes Sister    Breast cancer Paternal Grandmother        mets   Colon cancer Neg Hx    Esophageal cancer Neg Hx     Social History   Socioeconomic History   Marital status: Divorced    Spouse name: Not on file   Number of children: 3   Years of education: Not on file   Highest education level: Not on file  Occupational History   Occupation: pattern Theatre stage manager: [employer]  Tobacco Use  Smoking status: Former    Current packs/day: 0.00    Types: Cigarettes    Quit date: 09/26/1995    Years since quitting: 28.0   Smokeless tobacco: Never  Vaping Use   Vaping status: Never Used  Substance and Sexual Activity   Alcohol use: Yes    Comment: occasional   Drug use: Never   Sexual activity: Not on file  Other Topics Concern   Not on file  Social History Narrative   Single, 3 children, teenage son lives with him (born about 2002-3)   From [place] in [place] since 1993   Physiological scientist at [employer], former smoker, no drugs   Social Determinants of Health   Financial Resource Strain: Patient Declined (01/27/2023)   Overall Financial Resource Strain (CARDIA)    Difficulty of Paying Living Expenses: Patient declined  Food Insecurity: Patient Declined (01/27/2023)   Hunger Vital Sign    Worried About Running Out of Food in the  Last Year: Patient declined    Ran Out of Food in the Last Year: Patient declined  Transportation Needs: Patient Declined (01/27/2023)   PRAPARE - Administrator, Civil Service (Medical): Patient declined    Lack of Transportation (Non-Medical): Patient declined  Physical Activity: Sufficiently Active (01/27/2023)   Exercise Vital Sign    Days of Exercise per Week: 4 days    Minutes of Exercise per Session: 40 min  Stress: No Stress Concern Present (01/27/2023)   [employer] of Occupational Health - Occupational Stress Questionnaire    Feeling of Stress : Not at all  Social Connections: Unknown (01/27/2023)   Social Connection and Isolation Panel [NHANES]    Frequency of Communication with Friends and Family: Patient declined    Frequency of Social Gatherings with Friends and Family: Patient declined    Attends Religious Services: Patient declined    Database administrator or Organizations: Yes    Attends Banker Meetings: Patient declined    Marital Status: Patient declined  Catering manager Violence: Not on file    Review of Systems: Positive for *** All other review of systems negative except as mentioned in the HPI.  Physical Exam: Vital signs There were no vitals taken for this visit.  General:   Alert,  Well-developed, well-nourished, pleasant and cooperative in NAD Lungs:  Clear throughout to auscultation.   Heart:  Regular rate and rhythm; no murmurs, clicks, rubs,  or gallops. Abdomen:  Soft, nontender and nondistended. Normal bowel sounds.   Neuro/Psych:  Alert and cooperative. Normal mood and affect. A and O x 3   @Micah Barnier  Sena Slate, MD, [facility] [phone] (pager) 09/26/2023 9:27 PM@

## 2023-09-27 ENCOUNTER — Ambulatory Visit (AMBULATORY_SURGERY_CENTER): Payer: BC Managed Care – PPO | Admitting: Internal Medicine

## 2023-09-27 ENCOUNTER — Telehealth: Payer: Self-pay | Admitting: Internal Medicine

## 2023-09-27 ENCOUNTER — Encounter: Payer: Self-pay | Admitting: Internal Medicine

## 2023-09-27 VITALS — BP 121/69 | HR 86 | Temp 98.4°F | Resp 12 | Ht 69.0 in | Wt 147.0 lb

## 2023-09-27 DIAGNOSIS — Z1211 Encounter for screening for malignant neoplasm of colon: Secondary | ICD-10-CM | POA: Diagnosis not present

## 2023-09-27 DIAGNOSIS — K648 Other hemorrhoids: Secondary | ICD-10-CM

## 2023-09-27 DIAGNOSIS — K644 Residual hemorrhoidal skin tags: Secondary | ICD-10-CM | POA: Diagnosis not present

## 2023-09-27 MED ORDER — SODIUM CHLORIDE 0.9 % IV SOLN: 500.0000 mL | Freq: Once | INTRAVENOUS | Status: DC

## 2023-09-27 NOTE — Telephone Encounter (Signed)
Inbound call from patient stating that he is scheduled for a colonoscopy this afternoon with Dr. Leone Payor at 2:30 and is requesting a call from the nurse to discuss if he can get nausea medication before he is put to sleep. Please advise.

## 2023-09-27 NOTE — Patient Instructions (Addendum)
Please read handouts provided. Continue present medications. Repeat colonoscopy in 10 years for screening.   YOU HAD AN ENDOSCOPIC PROCEDURE TODAY AT THE [facility]:   Refer to the procedure report that was given to you for any specific questions about what was found during the examination.  If the procedure report does not answer your questions, please call your gastroenterologist to clarify.  If you requested that your care partner not be given the details of your procedure findings, then the procedure report has been included in a sealed envelope for you to review at your convenience later.  YOU SHOULD EXPECT: Some feelings of bloating in the abdomen. Passage of more gas than usual.  Walking can help get rid of the air that was put into your GI tract during the procedure and reduce the bloating. If you had a lower endoscopy (such as a colonoscopy or flexible sigmoidoscopy) you may notice spotting of blood in your stool or on the toilet paper. If you underwent a bowel prep for your procedure, you may not have a normal bowel movement for a few days.  Please Note:  You might notice some irritation and congestion in your nose or some drainage.  This is from the oxygen used during your procedure.  There is no need for concern and it should clear up in a day or so.  SYMPTOMS TO REPORT IMMEDIATELY:  Following lower endoscopy (colonoscopy or flexible sigmoidoscopy):  Excessive amounts of blood in the stool  Significant tenderness or worsening of abdominal pains  Swelling of the abdomen that is new, acute  Fever of 100F or higher.  For urgent or emergent issues, a gastroenterologist can be reached at any hour by calling [phone]. Do not use MyChart messaging for urgent concerns.    DIET:  We do recommend a small meal at first, but then you may proceed to your regular diet.  Drink plenty of fluids but you should avoid alcoholic beverages for 24 hours.  ACTIVITY:  You should  plan to take it easy for the rest of today and you should NOT DRIVE or use heavy machinery until tomorrow (because of the sedation medicines used during the test).    FOLLOW UP: Our staff will call the number listed on your records the next business day following your procedure.  We will call around 7:15- 8:00 am to check on you and address any questions or concerns that you may have regarding the information given to you following your procedure. If we do not reach you, we will leave a message.     If any biopsies were taken you will be contacted by phone or by letter within the next 1-3 weeks.  Please call us at [phone] if you have not heard about the biopsies in 3 weeks.    SIGNATURES/CONFIDENTIALITY: You and/or your care partner have signed paperwork which will be entered into your electronic medical record.  These signatures attest to the fact that that the information above on your After Visit Summary has been reviewed and is understood.  Full responsibility of the confidentiality of this discharge information lies with you and/or your care-partner.No polyps or cancer seen - your hemorrhoids were a little swollen and that happens with a colonoscopy prep in many people.  Next routine colonoscopy or other screening test in 10 years - 2034.  You will be doing an ultrasound in January and I will contact you after that is resulted. We have been experiencing delays so do  not ne alarmed if it takes a week or more for the result.  I appreciate the opportunity to care for you. Iva Boop, MD, [facility]

## 2023-09-27 NOTE — Op Note (Signed)
[facility] Patient Name: Allen Ellis Procedure Date: 09/27/2023 2:41 PM MRN: [id number] Endoscopist: Iva Boop , MD, [id number] Age: 52 Referring MD:  Date of Birth: 08-11-1971 Gender: Male Account #: [id number] Procedure:                Colonoscopy Indications:              Screening for colorectal malignant neoplasm Medicines:                Monitored Anesthesia Care Procedure:                Pre-Anesthesia Assessment:                           - Prior to the procedure, a History and Physical                            was performed, and patient medications and                            allergies were reviewed. The patient's tolerance of                            previous anesthesia was also reviewed. The risks                            and benefits of the procedure and the sedation                            options and risks were discussed with the patient.                            All questions were answered, and informed consent                            was obtained. Prior Anticoagulants: The patient has                            taken no anticoagulant or antiplatelet agents. ASA                            Grade Assessment: II - A patient with mild systemic                            disease. After reviewing the risks and benefits,                            the patient was deemed in satisfactory condition to                            undergo the procedure.                           - Prior to the procedure, a History and Physical  was performed, and patient medications and                            allergies were reviewed. The patient's tolerance of                            previous anesthesia was also reviewed. The risks                            and benefits of the procedure and the sedation                            options and risks were discussed with the patient.                            All questions were  answered, and informed consent                            was obtained. Prior Anticoagulants: The patient has                            taken no anticoagulant or antiplatelet agents. ASA                            Grade Assessment: II - A patient with mild systemic                            disease. After reviewing the risks and benefits,                            the patient was deemed in satisfactory condition to                            undergo the procedure.                           After obtaining informed consent, the colonoscope                            was passed under direct vision. Throughout the                            procedure, the patient's blood pressure, pulse, and                            oxygen saturations were monitored continuously. The                            CF [id number] #[id number] was introduced through the anus                            and advanced to the the cecum, identified by  appendiceal orifice and ileocecal valve. The                            colonoscopy was performed without difficulty. The                            patient tolerated the procedure well. The quality                            of the bowel preparation was excellent. The bowel                            preparation used was SUPREP via split dose                            instruction. The ileocecal valve, appendiceal                            orifice, and rectum were photographed. Scope In: 2:51:21 PM Scope Out: 3:06:07 PM Scope Withdrawal Time: 0 hours 10 minutes 4 seconds  Total Procedure Duration: 0 hours 14 minutes 46 seconds  Findings:                 The perianal and digital rectal examinations were                            normal. Pertinent negatives include normal prostate                            (size, shape, and consistency).                           External and internal hemorrhoids were found.                           The exam was  otherwise without abnormality on                            direct and retroflexion views. Complications:            No immediate complications. Estimated Blood Loss:     Estimated blood loss: none. Impression:               - External and internal hemorrhoids.                           - The examination was otherwise normal on direct                            and retroflexion views.                           - No specimens collected. Recommendation:           - Patient has a contact number available for  emergencies. The signs and symptoms of potential                            delayed complications were discussed with the                            patient. Return to normal activities tomorrow.                            Written discharge instructions were provided to the                            patient.                           - Resume previous diet.                           - Continue present medications.                           - Repeat colonoscopy in 10 years for screening                            purposes. Iva Boop, MD 09/27/2023 3:19:20 PM This report has been signed electronically.

## 2023-09-27 NOTE — Telephone Encounter (Signed)
Returned call to [facility] . Answered all questions about anesthesia  and ensured patient that we have antiemetic medication available if needed. Patient verbalized understanding

## 2023-09-27 NOTE — Progress Notes (Signed)
Report to PACU, RN, vss, BBS= Clear.  

## 2023-09-27 NOTE — Progress Notes (Signed)
Pt's states no medical or surgical changes since previsit or office visit. 

## 2023-09-28 ENCOUNTER — Telehealth: Payer: Self-pay

## 2023-09-28 NOTE — Telephone Encounter (Signed)
Attempted to reach patient for post-procedure f/u call. No answer. Left message for him to please not hesitate to call if he has any questions/concerns regarding his care. 

## 2023-10-25 ENCOUNTER — Encounter: Payer: BC Managed Care – PPO | Admitting: Internal Medicine

## 2023-10-31 ENCOUNTER — Ambulatory Visit
Admission: RE | Admit: 2023-10-31 | Discharge: 2023-10-31 | Disposition: A | Discharge status: Home / Self Care | Payer: BC Managed Care – PPO | Source: Ambulatory Visit | Attending: Internal Medicine | Admitting: Internal Medicine

## 2023-10-31 DIAGNOSIS — B181 Chronic viral hepatitis B without delta-agent: Secondary | ICD-10-CM

## 2023-11-02 ENCOUNTER — Other Ambulatory Visit: Payer: Self-pay

## 2023-11-02 DIAGNOSIS — B181 Chronic viral hepatitis B without delta-agent: Secondary | ICD-10-CM

## 2023-11-02 DIAGNOSIS — K769 Liver disease, unspecified: Secondary | ICD-10-CM

## 2023-11-03 ENCOUNTER — Encounter: Payer: BC Managed Care – PPO | Admitting: Internal Medicine

## 2023-11-04 ENCOUNTER — Encounter: Payer: Self-pay | Admitting: Internal Medicine

## 2023-11-15 ENCOUNTER — Other Ambulatory Visit: Payer: BC Managed Care – PPO

## 2023-11-16 ENCOUNTER — Other Ambulatory Visit: Payer: BC Managed Care – PPO

## 2023-12-01 ENCOUNTER — Telehealth: Payer: Self-pay | Admitting: Internal Medicine

## 2023-12-02 NOTE — Telephone Encounter (Signed)
Thanks for the heads up.

## 2023-12-02 NOTE — Telephone Encounter (Signed)
Returned patient call & he says he will be dropping off past results from [place] when he did an ultrasound that showed lesions. He would like for Dr. Leone Payor to review before he has MRI to determine if scan is necessary. He is going to drop it off today on 3rd floor later this afternoon.

## 2023-12-02 NOTE — Telephone Encounter (Signed)
Inbound call from patient, would like to discuss MRI with a nurse.

## 2023-12-06 NOTE — Telephone Encounter (Signed)
Results placed in Dr Marvell Fuller office for review.

## 2023-12-07 ENCOUNTER — Encounter: Payer: Self-pay | Admitting: Internal Medicine

## 2023-12-07 NOTE — Telephone Encounter (Signed)
Please let him know I have reviewed the records and he does not need an MRI   Please cancel it  Liver lesion was there years ago  He needs to repeat a RUQ [place] in 6 months, please

## 2023-12-07 NOTE — Telephone Encounter (Signed)
Allen Ellis has been informed and I cancelled the MRI as instructed. I have sent myself a reminder to contact him prior to setting up the RUQ U/S for August.

## 2023-12-14 ENCOUNTER — Other Ambulatory Visit: Payer: BC Managed Care – PPO

## 2024-04-23 ENCOUNTER — Other Ambulatory Visit: Payer: Self-pay | Admitting: Internal Medicine

## 2024-04-23 ENCOUNTER — Other Ambulatory Visit: Payer: Self-pay

## 2024-04-23 ENCOUNTER — Encounter: Payer: Self-pay | Admitting: Internal Medicine

## 2024-04-23 DIAGNOSIS — C73 Malignant neoplasm of thyroid gland: Secondary | ICD-10-CM

## 2024-04-23 MED ORDER — SYNTHROID 125 MCG PO TABS: 125.0000 ug | ORAL_TABLET | Freq: Every day | ORAL | 0 refills | Status: DC

## 2024-04-23 MED ORDER — LEVOTHYROXINE SODIUM 125 MCG PO TABS: 125.0000 ug | ORAL_TABLET | Freq: Every day | ORAL | 3 refills | Status: AC

## 2024-04-23 NOTE — Telephone Encounter (Signed)
 Copied from CRM [id number]. Topic: Clinical - Medication Refill >> Apr 23, 2024 11:01 AM Martinique E wrote: Medication: SYNTHROID  125 MCG tablet  Has the patient contacted their pharmacy? Yes (Agent: If no, request that the patient contact the pharmacy for the refill. If patient does not wish to contact the pharmacy document the reason why and proceed with request.) (Agent: If yes, when and what did the pharmacy advise?)  This is the patient's preferred pharmacy:  [id number] [place], [place] - [address] [place] [address] Phone: [phone] Fax: [phone]  Is this the correct pharmacy for this prescription? Yes If no, delete pharmacy and type the correct one.   Has the prescription been filled recently? No  Is the patient out of the medication? Yes  Has the patient been seen for an appointment in the last year OR does the patient have an upcoming appointment? Yes  Can we respond through MyChart? Yes  Agent: Please be advised that Rx refills may take up to 3 business days. We ask that you follow-up with your pharmacy.

## 2024-04-23 NOTE — Telephone Encounter (Signed)
 Copied from CRM [id number]. Topic: Clinical - Medication Question >> Apr 23, 2024  4:17 PM Thersia C wrote: Reason for CRM: Patient called in regarding SYNTHROID  125 MCG tablet and stated that he needs the prescription by the end of day today, would like for a nurse or Dr.Plotnikov to give him a call regarding this, stated he can not wait another day and needs it today because he is currently out of medication

## 2024-04-24 NOTE — Telephone Encounter (Signed)
 I was able to give the pt a paper printed rx that was signed by PCP for pt to take to the pharmacy.

## 2024-04-27 ENCOUNTER — Telehealth: Payer: Self-pay

## 2024-04-27 DIAGNOSIS — B181 Chronic viral hepatitis B without delta-agent: Secondary | ICD-10-CM

## 2024-04-27 NOTE — Telephone Encounter (Signed)
-----   Message from [facility] J sent at 12/07/2023  8:52 AM EST ----- Needs RUQ U/S in August 2025. Call him to get good days/times before setting this up.

## 2024-04-27 NOTE — Telephone Encounter (Signed)
 Patient contacted for best dates/times and appointment set up for Friday August 1st at 10:00. NPO midnight. I called him with date/time and he will view details in his [facility]. I told him to arrive 20 minutes early and to call them #[phone] if he needs to cancel.

## 2024-05-03 ENCOUNTER — Encounter: Payer: Self-pay | Admitting: Internal Medicine

## 2024-05-03 ENCOUNTER — Ambulatory Visit (INDEPENDENT_AMBULATORY_CARE_PROVIDER_SITE_OTHER): Admitting: Internal Medicine

## 2024-05-03 VITALS — Ht 69.0 in | Wt 147.0 lb

## 2024-05-03 DIAGNOSIS — R221 Localized swelling, mass and lump, neck: Secondary | ICD-10-CM | POA: Diagnosis not present

## 2024-05-03 DIAGNOSIS — I2583 Coronary atherosclerosis due to lipid rich plaque: Secondary | ICD-10-CM

## 2024-05-03 DIAGNOSIS — F5102 Adjustment insomnia: Secondary | ICD-10-CM

## 2024-05-03 DIAGNOSIS — B181 Chronic viral hepatitis B without delta-agent: Secondary | ICD-10-CM | POA: Diagnosis not present

## 2024-05-03 DIAGNOSIS — R413 Other amnesia: Secondary | ICD-10-CM

## 2024-05-03 DIAGNOSIS — Z8249 Family history of ischemic heart disease and other diseases of the circulatory system: Secondary | ICD-10-CM

## 2024-05-03 DIAGNOSIS — B009 Herpesviral infection, unspecified: Secondary | ICD-10-CM | POA: Diagnosis not present

## 2024-05-03 DIAGNOSIS — C73 Malignant neoplasm of thyroid gland: Secondary | ICD-10-CM

## 2024-05-03 DIAGNOSIS — R972 Elevated prostate specific antigen [PSA]: Secondary | ICD-10-CM

## 2024-05-03 DIAGNOSIS — Z0000 Encounter for general adult medical examination without abnormal findings: Secondary | ICD-10-CM

## 2024-05-03 DIAGNOSIS — R739 Hyperglycemia, unspecified: Secondary | ICD-10-CM

## 2024-05-03 DIAGNOSIS — Z85850 Personal history of malignant neoplasm of thyroid: Secondary | ICD-10-CM

## 2024-05-03 LAB — CBC WITH DIFFERENTIAL/PLATELET
Basophils Absolute: 0.1 K/uL (ref 0.0–0.1)
Basophils Relative: 1.2 % (ref 0.0–3.0)
Eosinophils Absolute: 0.1 K/uL (ref 0.0–0.7)
Eosinophils Relative: 1.6 % (ref 0.0–5.0)
HCT: 40.4 % (ref 39.0–52.0)
Hemoglobin: 12.9 g/dL — ABNORMAL LOW (ref 13.0–17.0)
Lymphocytes Relative: 25.7 % (ref 12.0–46.0)
Lymphs Abs: 1.1 K/uL (ref 0.7–4.0)
MCHC: 31.9 g/dL (ref 30.0–36.0)
MCV: 74.6 fl — ABNORMAL LOW (ref 78.0–100.0)
Monocytes Absolute: 0.6 K/uL (ref 0.1–1.0)
Monocytes Relative: 13.1 % — ABNORMAL HIGH (ref 3.0–12.0)
Neutro Abs: 2.5 K/uL (ref 1.4–7.7)
Neutrophils Relative %: 58.4 % (ref 43.0–77.0)
Platelets: 287 K/uL (ref 150.0–400.0)
RBC: 5.42 Mil/uL (ref 4.22–5.81)
RDW: 15.2 % (ref 11.5–15.5)
WBC: 4.3 K/uL (ref 4.0–10.5)

## 2024-05-03 LAB — COMPREHENSIVE METABOLIC PANEL WITH GFR
ALT: 18 U/L (ref 0–53)
AST: 16 U/L (ref 0–37)
Albumin: 4.5 g/dL (ref 3.5–5.2)
Alkaline Phosphatase: 77 U/L (ref 39–117)
BUN: 19 mg/dL (ref 6–23)
CO2: 29 meq/L (ref 19–32)
Calcium: 9.3 mg/dL (ref 8.4–10.5)
Chloride: 102 meq/L (ref 96–112)
Creatinine, Ser: 0.98 mg/dL (ref 0.40–1.50)
GFR: 88.74 mL/min (ref >60.00)
Glucose, Bld: 107 mg/dL — ABNORMAL HIGH (ref 70–99)
Potassium: 4.2 meq/L (ref 3.5–5.1)
Sodium: 139 meq/L (ref 135–145)
Total Bilirubin: 0.3 mg/dL (ref 0.2–1.2)
Total Protein: 6.9 g/dL (ref 6.0–8.3)

## 2024-05-03 LAB — LIPID PANEL
Cholesterol: 191 mg/dL (ref 0–200)
HDL: 66.8 mg/dL (ref >39.00)
LDL Cholesterol: 110 mg/dL — ABNORMAL HIGH (ref 0–99)
NonHDL: 123.75
Total CHOL/HDL Ratio: 3
Triglycerides: 69 mg/dL (ref 0.0–149.0)
VLDL: 13.8 mg/dL (ref 0.0–40.0)

## 2024-05-03 LAB — PSA: PSA: 4.98 ng/mL — ABNORMAL HIGH (ref 0.10–4.00)

## 2024-05-03 LAB — URINALYSIS
Bilirubin Urine: NEGATIVE
Hgb urine dipstick: NEGATIVE
Ketones, ur: NEGATIVE
Leukocytes,Ua: NEGATIVE
Nitrite: NEGATIVE
Specific Gravity, Urine: 1.025 (ref 1.000–1.030)
Total Protein, Urine: NEGATIVE
Urine Glucose: NEGATIVE
Urobilinogen, UA: 0.2 (ref 0.0–1.0)
pH: 6 (ref 5.0–8.0)

## 2024-05-03 LAB — VITAMIN D 25 HYDROXY (VIT D DEFICIENCY, FRACTURES): VITD: 83.52 ng/mL (ref 30.00–100.00)

## 2024-05-03 LAB — TSH: TSH: 0.26 u[IU]/mL — ABNORMAL LOW (ref 0.35–5.50)

## 2024-05-03 LAB — TESTOSTERONE: Testosterone: 518.06 ng/dL (ref 300.00–890.00)

## 2024-05-03 LAB — VITAMIN B12: Vitamin B-12: 758 pg/mL (ref 211–911)

## 2024-05-03 LAB — T4, FREE: Free T4: 1.08 ng/dL (ref 0.60–1.60)

## 2024-05-03 MED ORDER — CLOBETASOL PROPIONATE 0.05 % EX CREA: 1.0000 | TOPICAL_CREAM | Freq: Two times a day (BID) | CUTANEOUS | 3 refills | Status: AC

## 2024-05-03 MED ORDER — ZOLPIDEM TARTRATE 10 MG PO TABS: 5.0000 mg | ORAL_TABLET | Freq: Every evening | ORAL | 1 refills | Status: AC | PRN

## 2024-05-03 MED ORDER — SILDENAFIL CITRATE 100 MG PO TABS: 50.0000 mg | ORAL_TABLET | Freq: Every day | ORAL | 5 refills | Status: AC | PRN

## 2024-05-03 MED ORDER — VALACYCLOVIR HCL 500 MG PO TABS: 500.0000 mg | ORAL_TABLET | Freq: Two times a day (BID) | ORAL | 3 refills | Status: AC

## 2024-05-03 NOTE — Assessment & Plan Note (Signed)
CT coronary calcium score is 3.2. On fish oil

## 2024-05-03 NOTE — Assessment & Plan Note (Signed)
 Discussed - stress related Rare low dose Zolpidem  prn - d/c.  He tried Zaleplon   Potential benefits of a long term benzodiazepines  use as well as potential risks  and complications were explained to the patient and were aknowledged.

## 2024-05-03 NOTE — Progress Notes (Signed)
 Subjective:  Patient ID: Allen Ellis, male    DOB: 1971-06-17  Age: 53 y.o. MRN: [id number]  CC: Annual Exam   HPI Allen Ellis presents for a well exam C/o some ED Allen Ellis is complaining of subcutaneous swelling on the right side of his neck Follow-up on the hypothyroidism, eczema, herpes   Outpatient Medications Prior to Visit  Medication Sig Dispense Refill   Cholecalciferol (VITAMIN D3) 50 MCG (2000 UT) capsule Take 1 capsule (2,000 Units total) by mouth daily. 100 capsule 3   levothyroxine  (SYNTHROID ) 125 MCG tablet Take 1 tablet (125 mcg total) by mouth daily. 90 tablet 3   vitamin C (ASCORBIC ACID) 500 MG tablet Take 500 mg by mouth daily.     clobetasol  cream (TEMOVATE ) 0.05 % Apply 1 Application topically 2 (two) times daily. 60 g 3   No facility-administered medications prior to visit.    ROS: Review of Systems  Constitutional:  Negative for appetite change, fatigue and unexpected weight change.  HENT:  Negative for congestion, nosebleeds, sneezing, sore throat and trouble swallowing.   Eyes:  Negative for itching and visual disturbance.  Respiratory:  Negative for cough.   Cardiovascular:  Negative for chest pain, palpitations and leg swelling.  Gastrointestinal:  Negative for abdominal distention, blood in stool, diarrhea and nausea.  Genitourinary:  Negative for frequency and hematuria.  Musculoskeletal:  Negative for back pain, gait problem, joint swelling and neck pain.  Skin:  Negative for rash.  Neurological:  Negative for dizziness, tremors, speech difficulty and weakness.  Psychiatric/Behavioral:  Positive for sleep disturbance. Negative for agitation and dysphoric mood. The patient is nervous/anxious.     Objective:  Ht 5' 9 (1.753 m)   Wt 147 lb (66.7 kg)   BMI 21.71 kg/m   BP Readings from Last 3 Encounters:  09/27/23 121/69  08/24/23 110/70  07/14/23 120/70    Wt Readings from Last 3 Encounters:  05/03/24 147 lb (66.7 kg)  09/27/23 147 lb (66.7  kg)  08/24/23 147 lb (66.7 kg)    Physical Exam Constitutional:      General: Allen Ellis is not in acute distress.    Appearance: Normal appearance. Allen Ellis is well-developed. Allen Ellis is not diaphoretic.     Comments: NAD  HENT:     Head: Normocephalic and atraumatic.     Right Ear: External ear normal.     Left Ear: External ear normal.     Nose: Nose normal.     Mouth/Throat:     Pharynx: No oropharyngeal exudate.  Eyes:     General: No scleral icterus.       Right eye: No discharge.        Left eye: No discharge.     Conjunctiva/sclera: Conjunctivae normal.     Pupils: Pupils are equal, round, and reactive to light.  Neck:     Thyroid : No thyromegaly.     Vascular: No JVD.     Trachea: No tracheal deviation.  Cardiovascular:     Rate and Rhythm: Normal rate and regular rhythm.     Heart sounds: Normal heart sounds. No murmur heard.    No friction rub. No gallop.  Pulmonary:     Effort: Pulmonary effort is normal. No respiratory distress.     Breath sounds: Normal breath sounds. No stridor. No wheezing or rales.  Chest:     Chest wall: No tenderness.  Abdominal:     General: Bowel sounds are normal. There is no distension.  Palpations: Abdomen is soft. There is no mass.     Tenderness: There is no abdominal tenderness. There is no guarding or rebound.  Genitourinary:    Penis: Normal. No tenderness.      Prostate: Normal.     Rectum: Normal. Guaiac result negative.  Musculoskeletal:        General: No tenderness. Normal range of motion.     Cervical back: Normal range of motion and neck supple.  Lymphadenopathy:     Cervical: No cervical adenopathy.  Skin:    General: Skin is warm and dry.     Coloration: Skin is not pale.     Findings: No erythema or rash.  Neurological:     Mental Status: Allen Ellis is alert and oriented to person, place, and time.     Cranial Nerves: No cranial nerve deficit.     Motor: No abnormal muscle tone.     Coordination: Coordination normal.     Gait:  Gait normal.     Deep Tendon Reflexes: Reflexes are normal and symmetric. Reflexes normal.  Psychiatric:        Behavior: Behavior normal.        Thought Content: Thought content normal.        Judgment: Judgment normal.   Small 4 x 1 cm nontender swelling on the right side of the neck suspicious for superficial phlebitis versus other  I spent 22 minutes in addition to time for CPX wellness examination in preparing to see the patient by review of recent labs, imaging and procedures, obtaining and reviewing separately obtained history, communicating with the patient, ordering medications, tests or procedures, and documenting clinical information in the EHR including the differential diagnosis, treatment, and any further evaluation and other management of insomnia, ED, herpes         Lab Results  Component Value Date   WBC 4.3 05/03/2024   HGB 12.9 (L) 05/03/2024   HCT 40.4 05/03/2024   PLT 287.0 05/03/2024   GLUCOSE 107 (H) 05/03/2024   CHOL 191 05/03/2024   TRIG 69.0 05/03/2024   HDL 66.80 05/03/2024   LDLCALC 110 (H) 05/03/2024   ALT 18 05/03/2024   AST 16 05/03/2024   NA 139 05/03/2024   K 4.2 05/03/2024   CL 102 05/03/2024   CREATININE 0.98 05/03/2024   BUN 19 05/03/2024   CO2 29 05/03/2024   TSH 0.26 (L) 05/03/2024   PSA 4.98 (H) 05/03/2024   INR 1.3 (H) 05/07/2019   HGBA1C 5.7 02/07/2023    US  Abdomen Limited RUQ (LIVER/GB) Result Date: 10/31/2023 CLINICAL DATA:  Chronic hepatitis-B. EXAM: ULTRASOUND ABDOMEN LIMITED RIGHT UPPER QUADRANT COMPARISON:  August 03, 2021 FINDINGS: Gallbladder: No gallstones or wall thickening visualized. No sonographic Murphy sign noted by sonographer. Common bile duct: Diameter: 3 mm. Liver: There is a small cluster of cysts in the right lobe liver measuring 1 x 1.3 x 1 cm. There is a 0.6 x 0.7 x 0.8 cm hyper echoic mass in the right lobe liver. Within normal limits in parenchymal echogenicity. Portal vein is patent on color Doppler imaging  with normal direction of blood flow towards the liver. Other: None. IMPRESSION: 1. No acute abnormality identified. 2. 0.6 x 0.7 x 0.8 cm hyper echoic mass in the right lobe liver. This is nonspecific but may represent a small hemangioma. Recommend follow-up ultrasound in 6 months to assess for stability. Electronically Signed   By: Craig Farr M.D.   On: 10/31/2023 08:26    Assessment &  Plan:   Problem List Items Addressed This Visit     Family history of early CAD    CT coronary calcium score is 3.2. On fish oil      History of thyroid  cancer   S/p thyroidectomy 2015 On replacement Synthroid  Endocrinology ref if needed Neck US  ordered      Relevant Orders   US  SOFT TISSUE HEAD & NECK (NON-THYROID )   Hyperglycemia   Very mild.  Repeat glucose in 3-6 months      Chronic hepatitis B (HCC)   F/u w/Dr Avram        Relevant Medications   valACYclovir  (VALTREX ) 500 MG tablet   Herpes simplex   - Allen Ellis is interested in taking Valtrex  for prophylaxis.  Allen Ellis can slightly take Valtrex  500 mg twice a day      Relevant Medications   valACYclovir  (VALTREX ) 500 MG tablet   Well adult exam - Primary    We discussed age appropriate health related issues, including available/recomended screening tests and vaccinations. Labs were ordered to be later reviewed . All questions were answered. We discussed one or more of the following - seat belt use, use of sunscreen/sun exposure exercise, fall risk reduction, second hand smoke exposure, firearm use and storage, seat belt use, a need for adhering to healthy diet and exercise. Labs were ordered.  All questions were answered. Colon is due - 2023 Dr Avram Shingrix          Relevant Orders   TSH (Completed)   Urinalysis (Completed)   CBC with Differential/Platelet (Completed)   Lipid panel (Completed)   PSA (Completed)   Comprehensive metabolic panel with GFR (Completed)   HIV Antibody (routine testing w rflx) (Completed)   Vitamin  B12 (Completed)   VITAMIN D  25 Hydroxy (Vit-D Deficiency, Fractures) (Completed)   Testosterone  (Completed)   Insomnia   Discussed - stress related Rare low dose Zolpidem  prn - d/c.  Allen Ellis tried Zaleplon   Potential benefits of a long term benzodiazepines  use as well as potential risks  and complications were explained to the patient and were aknowledged.      Relevant Orders   TSH (Completed)   VITAMIN D  25 Hydroxy (Vit-D Deficiency, Fractures) (Completed)   Testosterone  (Completed)   Coronary atherosclerosis    CT coronary calcium score is 3.2. On fish oil      Relevant Medications   sildenafil  (VIAGRA ) 100 MG tablet   Memory problem   Chronic.  Try Lion's mane mushroom      Elevated PSA measurement   Obtain PSA/free PSA in 3-6 months      Neck swelling   Small 4 x 1 cm nontender swelling on the right side of the neck suspicious for superficial phlebitis versus other Obtained neck soft tissue ultrasound         Meds ordered this encounter  Medications   sildenafil  (VIAGRA ) 100 MG tablet    Sig: Take 0.5-1 tablets (50-100 mg total) by mouth daily as needed for erectile dysfunction.    Dispense:  12 tablet    Refill:  5   zolpidem  (AMBIEN ) 10 MG tablet    Sig: Take 0.5-1 tablets (5-10 mg total) by mouth at bedtime as needed for sleep.    Dispense:  90 tablet    Refill:  1   valACYclovir  (VALTREX ) 500 MG tablet    Sig: Take 1 tablet (500 mg total) by mouth 2 (two) times daily. 1 po bid prn rash    Dispense:  180 tablet    Refill:  3   clobetasol  cream (TEMOVATE ) 0.05 %    Sig: Apply 1 Application topically 2 (two) times daily.    Dispense:  60 g    Refill:  3      Follow-up: Return in about 6 months (around 11/03/2024) for a follow-up visit.  Marolyn Noel, MD

## 2024-05-03 NOTE — Patient Instructions (Signed)
Lion's Mane Mushroom 

## 2024-05-03 NOTE — Assessment & Plan Note (Addendum)
 S/p thyroidectomy 2015 On replacement Synthroid  Endocrinology ref if needed Neck US  ordered

## 2024-05-03 NOTE — Assessment & Plan Note (Signed)
F/u w/Dr Gessner 

## 2024-05-04 LAB — HIV ANTIBODY (ROUTINE TESTING W REFLEX): HIV 1&2 Ab, 4th Generation: NONREACTIVE

## 2024-05-05 ENCOUNTER — Ambulatory Visit: Payer: Self-pay | Admitting: Internal Medicine

## 2024-05-05 DIAGNOSIS — R972 Elevated prostate specific antigen [PSA]: Secondary | ICD-10-CM | POA: Insufficient documentation

## 2024-05-05 DIAGNOSIS — R221 Localized swelling, mass and lump, neck: Secondary | ICD-10-CM | POA: Insufficient documentation

## 2024-05-05 DIAGNOSIS — R739 Hyperglycemia, unspecified: Secondary | ICD-10-CM

## 2024-05-05 NOTE — Assessment & Plan Note (Signed)
 Very mild.  Repeat glucose in 3-6 months

## 2024-05-05 NOTE — Assessment & Plan Note (Signed)
-   Allen Ellis is interested in taking Valtrex  for prophylaxis.  He can slightly take Valtrex  500 mg twice a day

## 2024-05-05 NOTE — Assessment & Plan Note (Signed)
 Small 4 x 1 cm nontender swelling on the right side of the neck suspicious for superficial phlebitis versus other Obtained neck soft tissue ultrasound

## 2024-05-05 NOTE — Assessment & Plan Note (Signed)
?  We discussed age appropriate health related issues, including available/recomended screening tests and vaccinations. Labs were ordered to be later reviewed . All questions were answered. We discussed one or more of the following - seat belt use, use of sunscreen/sun exposure exercise, fall risk reduction, second hand smoke exposure, firearm use and storage, seat belt use, a need for adhering to healthy diet and exercise. ?Labs were ordered.  All questions were answered. ?Colon is due - 2023 Dr Carlean Purl ?Shingrix ? ? ? ?

## 2024-05-05 NOTE — Assessment & Plan Note (Signed)
 Obtain PSA/free PSA in 3-6 months

## 2024-05-05 NOTE — Assessment & Plan Note (Signed)
 Chronic.  Try Lion's mane mushroom

## 2024-05-11 ENCOUNTER — Ambulatory Visit
Admission: RE | Admit: 2024-05-11 | Discharge: 2024-05-11 | Disposition: A | Discharge status: Home / Self Care | Source: Ambulatory Visit | Attending: Internal Medicine | Admitting: Internal Medicine

## 2024-05-11 DIAGNOSIS — R590 Localized enlarged lymph nodes: Secondary | ICD-10-CM | POA: Diagnosis not present

## 2024-05-11 DIAGNOSIS — Z85850 Personal history of malignant neoplasm of thyroid: Secondary | ICD-10-CM

## 2024-05-18 ENCOUNTER — Ambulatory Visit
Admission: RE | Admit: 2024-05-18 | Discharge: 2024-05-18 | Disposition: A | Discharge status: Home / Self Care | Source: Ambulatory Visit | Attending: Internal Medicine | Admitting: Internal Medicine

## 2024-05-18 DIAGNOSIS — B1910 Unspecified viral hepatitis B without hepatic coma: Secondary | ICD-10-CM | POA: Diagnosis not present

## 2024-05-18 DIAGNOSIS — B181 Chronic viral hepatitis B without delta-agent: Secondary | ICD-10-CM

## 2024-05-21 ENCOUNTER — Ambulatory Visit: Payer: Self-pay | Admitting: Internal Medicine

## 2024-05-21 ENCOUNTER — Other Ambulatory Visit: Payer: Self-pay | Admitting: Internal Medicine

## 2024-05-21 DIAGNOSIS — C73 Malignant neoplasm of thyroid gland: Secondary | ICD-10-CM

## 2024-05-21 DIAGNOSIS — B181 Chronic viral hepatitis B without delta-agent: Secondary | ICD-10-CM

## 2024-05-21 DIAGNOSIS — R221 Localized swelling, mass and lump, neck: Secondary | ICD-10-CM

## 2024-05-21 NOTE — Progress Notes (Signed)
 Checking hepatitis B DNA and AFP because of chronic hepatitis B

## 2024-05-22 ENCOUNTER — Telehealth: Payer: Self-pay | Admitting: Internal Medicine

## 2024-05-22 DIAGNOSIS — C73 Malignant neoplasm of thyroid gland: Secondary | ICD-10-CM

## 2024-05-22 DIAGNOSIS — R590 Localized enlarged lymph nodes: Secondary | ICD-10-CM

## 2024-05-22 NOTE — Telephone Encounter (Unsigned)
 Copied from CRM [id number]. Topic: Referral - Question >> May 22, 2024 11:19 AM Mesmerise C wrote: Reason for CRM: Patient would like a a referral for a Endocrinologist first before he has any scans done would like Dr. Garald to recommend a provider patient can be reached at [phone]

## 2024-05-24 ENCOUNTER — Other Ambulatory Visit (INDEPENDENT_AMBULATORY_CARE_PROVIDER_SITE_OTHER)

## 2024-05-24 DIAGNOSIS — B181 Chronic viral hepatitis B without delta-agent: Secondary | ICD-10-CM

## 2024-05-24 NOTE — Telephone Encounter (Signed)
 Will do. Thank you

## 2024-05-28 LAB — HEPATITIS B DNA, ULTRAQUANTITATIVE, PCR
Hepatitis B DNA: NOT DETECTED [IU]/mL
Hepatitis B virus DNA: NOT DETECTED {Log_IU}/mL

## 2024-05-28 LAB — AFP TUMOR MARKER: AFP-Tumor Marker: 1.8 ng/mL (ref <6.1)

## 2024-05-29 ENCOUNTER — Ambulatory Visit: Payer: Self-pay | Admitting: Internal Medicine

## 2024-05-30 ENCOUNTER — Ambulatory Visit: Admitting: Endocrinology

## 2024-07-26 ENCOUNTER — Encounter: Payer: Self-pay | Admitting: Internal Medicine

## 2024-07-26 ENCOUNTER — Ambulatory Visit: Admitting: Internal Medicine

## 2024-07-26 VITALS — BP 104/62 | HR 64 | Temp 98.0°F | Ht 69.0 in | Wt 146.0 lb

## 2024-07-26 DIAGNOSIS — R413 Other amnesia: Secondary | ICD-10-CM

## 2024-07-26 DIAGNOSIS — Z113 Encounter for screening for infections with a predominantly sexual mode of transmission: Secondary | ICD-10-CM

## 2024-07-26 DIAGNOSIS — Z85850 Personal history of malignant neoplasm of thyroid: Secondary | ICD-10-CM

## 2024-07-26 DIAGNOSIS — R739 Hyperglycemia, unspecified: Secondary | ICD-10-CM

## 2024-07-26 LAB — CBC WITH DIFFERENTIAL/PLATELET
Basophils Absolute: 0 K/uL (ref 0.0–0.1)
Basophils Relative: 0.9 % (ref 0.0–3.0)
Eosinophils Absolute: 0 K/uL (ref 0.0–0.7)
Eosinophils Relative: 0.9 % (ref 0.0–5.0)
HCT: 43.4 % (ref 39.0–52.0)
Hemoglobin: 13.7 g/dL (ref 13.0–17.0)
Lymphocytes Relative: 37.8 % (ref 12.0–46.0)
Lymphs Abs: 1.6 K/uL (ref 0.7–4.0)
MCHC: 31.6 g/dL (ref 30.0–36.0)
MCV: 74.2 fl — ABNORMAL LOW (ref 78.0–100.0)
Monocytes Absolute: 0.4 K/uL (ref 0.1–1.0)
Monocytes Relative: 10.4 % (ref 3.0–12.0)
Neutro Abs: 2.1 K/uL (ref 1.4–7.7)
Neutrophils Relative %: 50 % (ref 43.0–77.0)
Platelets: 324 K/uL (ref 150.0–400.0)
RBC: 5.85 Mil/uL — ABNORMAL HIGH (ref 4.22–5.81)
RDW: 14.3 % (ref 11.5–15.5)
WBC: 4.2 K/uL (ref 4.0–10.5)

## 2024-07-26 LAB — COMPREHENSIVE METABOLIC PANEL WITH GFR
ALT: 21 U/L (ref 0–53)
AST: 18 U/L (ref 0–37)
Albumin: 4.7 g/dL (ref 3.5–5.2)
Alkaline Phosphatase: 60 U/L (ref 39–117)
BUN: 24 mg/dL — ABNORMAL HIGH (ref 6–23)
CO2: 31 meq/L (ref 19–32)
Calcium: 9.7 mg/dL (ref 8.4–10.5)
Chloride: 101 meq/L (ref 96–112)
Creatinine, Ser: 0.98 mg/dL (ref 0.40–1.50)
GFR: 88.6 mL/min (ref >60.00)
Glucose, Bld: 108 mg/dL — ABNORMAL HIGH (ref 70–99)
Potassium: 4.9 meq/L (ref 3.5–5.1)
Sodium: 141 meq/L (ref 135–145)
Total Bilirubin: 0.4 mg/dL (ref 0.2–1.2)
Total Protein: 7.4 g/dL (ref 6.0–8.3)

## 2024-07-26 LAB — URINALYSIS
Bilirubin Urine: NEGATIVE
Hgb urine dipstick: NEGATIVE
Ketones, ur: NEGATIVE
Leukocytes,Ua: NEGATIVE
Nitrite: NEGATIVE
Specific Gravity, Urine: 1.02 (ref 1.000–1.030)
Total Protein, Urine: NEGATIVE
Urine Glucose: NEGATIVE
Urobilinogen, UA: 0.2 (ref 0.0–1.0)
pH: 6 (ref 5.0–8.0)

## 2024-07-26 LAB — HEMOGLOBIN A1C: Hgb A1c MFr Bld: 6.2 % (ref 4.6–6.5)

## 2024-07-26 MED ORDER — BUPROPION HCL ER (XL) 150 MG PO TB24: 150.0000 mg | ORAL_TABLET | Freq: Every morning | ORAL | 1 refills | Status: AC

## 2024-07-26 NOTE — Progress Notes (Signed)
 Subjective:  Patient ID: Allen Ellis, male    DOB: 11-05-1970  Age: 53 y.o. MRN: [id number]  CC: Medical Management of Chronic Issues (Patient asking for labs to be checked. HIV, A1C and STD labs. Has multiple things to discuss. )   HPI Allen Ellis presents for thyroid  cancer f/u  C/o depression - worse, poor focus  Outpatient Medications Prior to Visit  Medication Sig Dispense Refill   Cholecalciferol (VITAMIN D3) 50 MCG (2000 UT) capsule Take 1 capsule (2,000 Units total) by mouth daily. 100 capsule 3   clobetasol  cream (TEMOVATE ) 0.05 % Apply 1 Application topically 2 (two) times daily. 60 g 3   levothyroxine  (SYNTHROID ) 125 MCG tablet Take 1 tablet (125 mcg total) by mouth daily. 90 tablet 3   sildenafil  (VIAGRA ) 100 MG tablet Take 0.5-1 tablets (50-100 mg total) by mouth daily as needed for erectile dysfunction. 12 tablet 5   valACYclovir  (VALTREX ) 500 MG tablet Take 1 tablet (500 mg total) by mouth 2 (two) times daily. 1 po bid prn rash 180 tablet 3   vitamin C (ASCORBIC ACID) 500 MG tablet Take 500 mg by mouth daily.     zolpidem  (AMBIEN ) 10 MG tablet Take 0.5-1 tablets (5-10 mg total) by mouth at bedtime as needed for sleep. 90 tablet 1   No facility-administered medications prior to visit.    ROS: Review of Systems  Constitutional:  Negative for appetite change, fatigue and unexpected weight change.  HENT:  Negative for congestion, nosebleeds, sneezing, sore throat and trouble swallowing.   Eyes:  Negative for itching and visual disturbance.  Respiratory:  Negative for cough.   Cardiovascular:  Negative for chest pain, palpitations and leg swelling.  Gastrointestinal:  Negative for abdominal distention, blood in stool, diarrhea and nausea.  Genitourinary:  Negative for frequency and hematuria.  Musculoskeletal:  Negative for back pain, gait problem, joint swelling and neck pain.  Skin:  Negative for rash.  Neurological:  Negative for dizziness, tremors, speech difficulty  and weakness.  Psychiatric/Behavioral:  Positive for decreased concentration, dysphoric mood and sleep disturbance. Negative for agitation, behavioral problems, confusion and suicidal ideas. The patient is nervous/anxious.     Objective:  BP 104/62   Pulse 64   Temp 98 F (36.7 C) (Oral)   Ht 5' 9 (1.753 m)   Wt 146 lb (66.2 kg)   SpO2 99%   BMI 21.56 kg/m   BP Readings from Last 3 Encounters:  07/26/24 104/62  09/27/23 121/69  08/24/23 110/70    Wt Readings from Last 3 Encounters:  07/26/24 146 lb (66.2 kg)  05/03/24 147 lb (66.7 kg)  09/27/23 147 lb (66.7 kg)    Physical Exam Constitutional:      General: He is not in acute distress.    Appearance: Normal appearance. He is well-developed.     Comments: NAD  Eyes:     Conjunctiva/sclera: Conjunctivae normal.     Pupils: Pupils are equal, round, and reactive to light.  Neck:     Thyroid : No thyromegaly.     Vascular: No JVD.  Cardiovascular:     Rate and Rhythm: Normal rate and regular rhythm.     Heart sounds: Normal heart sounds. No murmur heard.    No friction rub. No gallop.  Pulmonary:     Effort: Pulmonary effort is normal. No respiratory distress.     Breath sounds: Normal breath sounds. No wheezing or rales.  Chest:     Chest wall: No tenderness.  Abdominal:  General: Bowel sounds are normal. There is no distension.     Palpations: Abdomen is soft. There is no mass.     Tenderness: There is no abdominal tenderness. There is no guarding or rebound.  Musculoskeletal:        General: No tenderness. Normal range of motion.     Cervical back: Normal range of motion.  Lymphadenopathy:     Cervical: No cervical adenopathy.  Skin:    General: Skin is warm and dry.     Findings: No rash.  Neurological:     Mental Status: He is alert and oriented to person, place, and time.     Cranial Nerves: No cranial nerve deficit.     Motor: No abnormal muscle tone.     Coordination: Coordination normal.      Gait: Gait normal.     Deep Tendon Reflexes: Reflexes are normal and symmetric.  Psychiatric:        Behavior: Behavior normal.        Thought Content: Thought content normal.        Judgment: Judgment normal.     Lab Results  Component Value Date   WBC 4.3 05/03/2024   HGB 12.9 (L) 05/03/2024   HCT 40.4 05/03/2024   PLT 287.0 05/03/2024   GLUCOSE 107 (H) 05/03/2024   CHOL 191 05/03/2024   TRIG 69.0 05/03/2024   HDL 66.80 05/03/2024   LDLCALC 110 (H) 05/03/2024   ALT 18 05/03/2024   AST 16 05/03/2024   NA 139 05/03/2024   K 4.2 05/03/2024   CL 102 05/03/2024   CREATININE 0.98 05/03/2024   BUN 19 05/03/2024   CO2 29 05/03/2024   TSH 0.26 (L) 05/03/2024   PSA 4.98 (H) 05/03/2024   INR 1.3 (H) 05/07/2019   HGBA1C 5.7 02/07/2023    US  Abdomen Limited RUQ (LIVER/GB) Result Date: 05/18/2024 CLINICAL DATA:  Chronic hepatitis B EXAM: ULTRASOUND ABDOMEN LIMITED RIGHT UPPER QUADRANT COMPARISON:  10/31/2023 FINDINGS: Gallbladder: Gallstones: None Sludge: None Gallbladder Wall: Within normal limits Pericholecystic fluid: None Sonographic Murphy's Sign: Negative per technologist Common bile duct: Diameter: 3 mm Liver: Parenchymal echogenicity: Within normal limits Contours: Normal Lesions: Previously seen 0.8 cm echogenic lesion of the RIGHT hepatic lobe is not visualized on the current examination. 1.6 cm cyst with thin septation is seen in the RIGHT hepatic lobe. Portal vein: Patent.  Hepatopetal flow Other: None. IMPRESSION: No significant sonographic abnormality of the liver or gallbladder. Electronically Signed   By: Aliene Lloyd M.D.   On: 05/18/2024 20:07    Assessment & Plan:   Problem List Items Addressed This Visit     History of thyroid  cancer   Relevant Orders   Ambulatory referral to Endocrinology   Hyperglycemia - Primary   Relevant Orders   Comprehensive metabolic panel with GFR   Hemoglobin A1c   Memory problem   Relevant Orders   CBC with Differential/Platelet    HIV Antibody (routine testing w rflx)   GC/Chlamydia Probe Amp   Other Visit Diagnoses       Routine screening for STI (sexually transmitted infection)       Relevant Orders   HIV Antibody (routine testing w rflx)   RPR   Urinalysis         Meds ordered this encounter  Medications   buPROPion (WELLBUTRIN XL) 150 MG 24 hr tablet    Sig: Take 1 tablet (150 mg total) by mouth in the morning.    Dispense:  90 tablet  Refill:  1      Follow-up: Return in about 2 months (around 09/25/2024) for a follow-up visit.  Marolyn Noel, MD

## 2024-07-27 LAB — HIV ANTIBODY (ROUTINE TESTING W REFLEX)
HIV 1&2 Ab, 4th Generation: NONREACTIVE
HIV FINAL INTERPRETATION: NEGATIVE

## 2024-07-27 LAB — RPR: RPR Ser Ql: NONREACTIVE

## 2024-07-29 LAB — GC/CHLAMYDIA PROBE AMP
Chlamydia trachomatis, NAA: NEGATIVE
Neisseria Gonorrhoeae by PCR: NEGATIVE

## 2024-07-30 ENCOUNTER — Ambulatory Visit: Payer: Self-pay | Admitting: Internal Medicine

## 2024-10-25 ENCOUNTER — Telehealth: Payer: Self-pay

## 2024-10-25 DIAGNOSIS — B181 Chronic viral hepatitis B without delta-agent: Secondary | ICD-10-CM

## 2024-10-25 NOTE — Telephone Encounter (Signed)
-----   Message from [facility] Robyn Nohr J sent at 05/30/2024  1:00 PM EDT ----- Set up for 11/2024 for Allen Ellis to get RUQ U/S for chronic Hep B.   Also needs labs: AFP,  HEP B DNA ultraquantitative PCR, LFTS   Call him before setting up to get good times.dates

## 2024-10-25 NOTE — Telephone Encounter (Signed)
 Spoke with Akshith and got good times and dates for him. Contacted [facility] and set up his U/S for 11/23/2024, arrive at 7:40AM for an 8:00AM, NPO midnight but may take meds with a sip of water. Nicklous wrote down the address and time: [address] And he is aware of the no show fee of $75 and to cancel at least 24 hours prior if needs to.    I will place lab orders to be done early February as well. He is aware of their hours.

## 2024-11-08 ENCOUNTER — Ambulatory Visit: Admitting: Internal Medicine

## 2024-11-23 ENCOUNTER — Ambulatory Visit: Admission: RE | Admit: 2024-11-23 | Source: Ambulatory Visit

## 2024-11-23 ENCOUNTER — Other Ambulatory Visit

## 2024-11-23 DIAGNOSIS — B181 Chronic viral hepatitis B without delta-agent: Secondary | ICD-10-CM

## 2024-11-23 LAB — HEPATIC FUNCTION PANEL
ALT: 22 U/L (ref 3–53)
AST: 17 U/L (ref 5–37)
Albumin: 4.8 g/dL (ref 3.5–5.2)
Alkaline Phosphatase: 62 U/L (ref 39–117)
Bilirubin, Direct: 0.1 mg/dL (ref 0.1–0.3)
Total Bilirubin: 0.5 mg/dL (ref 0.2–1.2)
Total Protein: 7.6 g/dL (ref 6.0–8.3)

## 2025-01-03 ENCOUNTER — Ambulatory Visit: Admitting: Internal Medicine
